# Patient Record
Sex: Male | Born: 1940
Health system: Southern US, Community
[De-identification: ages and names within clinical notes are randomized; demographics above are authoritative.]

## PROBLEM LIST (undated history)

## (undated) DIAGNOSIS — G4733 Obstructive sleep apnea (adult) (pediatric): Secondary | ICD-10-CM

## (undated) DIAGNOSIS — M1990 Unspecified osteoarthritis, unspecified site: Secondary | ICD-10-CM

## (undated) DIAGNOSIS — I2510 Atherosclerotic heart disease of native coronary artery without angina pectoris: Secondary | ICD-10-CM

## (undated) DIAGNOSIS — E785 Hyperlipidemia, unspecified: Secondary | ICD-10-CM

## (undated) DIAGNOSIS — R351 Nocturia: Secondary | ICD-10-CM

## (undated) DIAGNOSIS — C61 Malignant neoplasm of prostate: Secondary | ICD-10-CM

## (undated) DIAGNOSIS — R972 Elevated prostate specific antigen [PSA]: Secondary | ICD-10-CM

## (undated) DIAGNOSIS — Z872 Personal history of diseases of the skin and subcutaneous tissue: Secondary | ICD-10-CM

## (undated) DIAGNOSIS — Z87448 Personal history of other diseases of urinary system: Secondary | ICD-10-CM

## (undated) HISTORY — DX: Elevated prostate specific antigen (PSA): R97.20

## (undated) HISTORY — DX: Hyperlipidemia, unspecified: E78.5

## (undated) HISTORY — PX: CARDIAC CATHETERIZATION: SHX172

## (undated) HISTORY — DX: Nocturia: R35.1

## (undated) HISTORY — DX: Malignant neoplasm of prostate: C61

## (undated) HISTORY — PX: PROSTATE BIOPSY: SHX241

## (undated) HISTORY — DX: Obstructive sleep apnea (adult) (pediatric): G47.33

## (undated) HISTORY — DX: Atherosclerotic heart disease of native coronary artery without angina pectoris: I25.10

---
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
## 1994-01-06 DIAGNOSIS — C439 Malignant melanoma of skin, unspecified: Secondary | ICD-10-CM

## 1994-01-06 HISTORY — DX: Malignant melanoma of skin, unspecified: C43.9

## 1995-01-07 HISTORY — PX: MELANOMA EXCISION: SHX5266

## 2002-01-06 DIAGNOSIS — Z872 Personal history of diseases of the skin and subcutaneous tissue: Secondary | ICD-10-CM | POA: Insufficient documentation

## 2002-01-06 HISTORY — DX: Personal history of diseases of the skin and subcutaneous tissue: Z87.2

## 2009-11-06 HISTORY — PX: ROTATOR CUFF REPAIR: SHX139

## 2010-12-20 DIAGNOSIS — C61 Malignant neoplasm of prostate: Secondary | ICD-10-CM

## 2010-12-20 HISTORY — DX: Malignant neoplasm of prostate: C61

## 2011-01-13 ENCOUNTER — Encounter: Payer: Self-pay | Admitting: Radiation Oncology

## 2011-01-13 NOTE — Progress Notes (Signed)
71 year old male. Married with three children. Works full-time.   Elevated PSA noted since 12/12/2009. Patient returned frequently to his urologist for surveillance but, refused biopsy. 11/25/2010 PSA 4.9. 12/20/10 biopsy returns with prostate ca at right base Gleason 6 (3+3). Patient is interested in seeds.   NKDA No indication of a pacemaker No history of radiation therapy

## 2011-01-14 ENCOUNTER — Ambulatory Visit
Admission: RE | Admit: 2011-01-14 | Discharge: 2011-01-14 | Disposition: A | Discharge status: Home / Self Care | Payer: Medicare Other | Source: Ambulatory Visit | Attending: Radiation Oncology | Admitting: Radiation Oncology

## 2011-01-14 ENCOUNTER — Encounter: Payer: Self-pay | Admitting: Radiation Oncology

## 2011-01-14 VITALS — BP 173/76 | HR 65 | Temp 97.4°F | Resp 18 | Ht 72.0 in | Wt 315.5 lb

## 2011-01-14 DIAGNOSIS — Z85820 Personal history of malignant melanoma of skin: Secondary | ICD-10-CM | POA: Insufficient documentation

## 2011-01-14 DIAGNOSIS — C61 Malignant neoplasm of prostate: Secondary | ICD-10-CM

## 2011-01-14 NOTE — Progress Notes (Signed)
Patient presents to the clinic today accompanied by his wife for an initial consultation with Dr. Dayton Scrape. Patient is alert and oriented to person, place, and time. No distress noted. Pleasant affect noted. Patient denies pain at this time. Patient reports getting up 2-3 times during the night to void. Patient reports that he stopped taking his Flomax some time ago because it caused constipation. Patient reports only taking Celebrex for a short time follow rotator cuff surgery. Patient reports that he does not take any prescribed medication. Patient reports that he saw blood in his urine for 2.5 weeks following biopsy. Patient denies pain or burning with urination. Patient reports that he work regularly but not as much as he did before his surgery and biopsy. Patient brought disc of CT done just prior to this consultation for Dr. Dayton Scrape to review. Reported all findings to Dr. Dayton Scrape. IPSS score 4.

## 2011-01-14 NOTE — Progress Notes (Signed)
Completed PATIENT MEASURE OF DISTRESS worksheet with a score of 0 turned into social work.

## 2011-01-14 NOTE — Progress Notes (Signed)
[facility] Radiation Oncology NEW PATIENT EVALUATION  Name: Anthony Perkins MRN: [id number]  Date: 01/14/2011  DOB: 03-Nov-1940  Status: outpatient   CC: No primary provider on file.  Debroah Baller, MD    REFERRING PHYSICIAN: Debroah Baller, MD   DIAGNOSIS: Stage TI C. favorable risk adenocarcinoma of the prostate  HISTORY OF PRESENT ILLNESS:  Anthony Perkins is a 71 y.o. male who is seen today for the courtesy of Dr. Saddie Benders for evaluation of his stage TI C. stable risk adenocarcinoma prostate he was noted to have a rise in his PSA to 4.9 on 09/30/2010. He underwent ultrasound-guided biopsies on 12/20/2010 revealing Gleason 6 (3+3) adenocarcinoma involving 50% of one core from the right lateral base, 16% of one core from the right lateral mid gland and 6% of one core from the right medial base. Prostate volume was 42 cc with a prosthetic length of 4.2 cm. Remaining biopsies were benign. He does have a history of urinary obstructive symptomatology and he did take Flomax for a short time but this was discontinued secondary to constipation. All Flomax, his IPS S. score today is 4. No GI difficulties. He does have erectile dysfunction. He was given a prescription for Cialis but he never took filledthe prescription. He is not sexually active.    PREVIOUS RADIATION THERAPY: No   PAST MEDICAL HISTORY:  has a past medical history of BPH with obstruction/lower urinary tract symptoms; PSA elevation; Elevated prostate specific antigen (PSA); and Nocturia.     PAST SURGICAL HISTORY:  Past Surgical History  Procedure Date  . Tonsillectomy   . Prostate biopsy   . Rotator cutff surgery     right shoulder   . Melanoma removed 1997    left ear     FAMILY HISTORY: family history includes Cancer in his father and paternal grandmother. his father died from cardiac disease at age 25. His mother died from complications of Alzheimer's disease in a hip fracture in her 9s. No family history of  prostate cancer.   SOCIAL HISTORY:  reports that he has never smoked. He has never used smokeless tobacco. He reports that he does not drink alcohol or use illicit drugs. He works as a Naval architect.   ALLERGIES: Hydrocodone   MEDICATIONS:  Current Outpatient Prescriptions  Medication Sig Dispense Refill  . celecoxib (CELEBREX) 100 MG capsule Take 100 mg by mouth 2 (two) times daily.        . Tamsulosin HCl (FLOMAX) 0.4 MG CAPS Take 0.4 mg by mouth daily. 0.4 mg capsule one oral daily, 1/2 hour after supper           REVIEW OF SYSTEMS:  Pertinent items are noted in HPI.    PHYSICAL EXAM:  height is 6' (1.829 m) and weight is 315 lb 8 oz (143.11 kg). His oral temperature is 97.4 F (36.3 C). His blood pressure is 173/76 and his pulse is 65. His respiration is 18.   Head and neck examination: Grossly unremarkable. Nodes: Without palpable cervical or supraclavicular lymphadenopathy. Chest: Lungs clear. Back: Without spinal or CVA discomfort. Heart: Regular in rhythm. Abdomen: Obese, without palpable hepatomegaly. Genitalia: Grossly unremarkable to inspection. Rectal: I am only able to palpate the inferior aspect of his prostate which is without induration or nodularity. Extremities trace ankle edema. Neurologic examination: Grossly nonfocal.   LABORATORY DATA:  No results found for this basename: WBC, HGB, HCT, MCV, PLT   No results found for this basename: NA, K, CL,  CO2   No results found for this basename: ALT, AST, GGT, ALKPHOS, BILITOT   PSA 4.9 from 09/30/2010.    IMPRESSION: Stage TI C. favorable risk adenocarcinoma prostate. I explained to the patient and his wife that his prognosis is related to his stage, PSA level, and Gleason score. All are favorable. We discussed surgery versus observation versus radiation therapy. Radiation therapy options include 8 weeks of external beam/IMRT or seed implantation alone. I explained to the patient that in patients in his situation are  more likely to died from cardiac disease rather than prostate cancer. On the other hand, in patients who have a 10 year life expectancy, it is certainly reasonable to proceed with curative treatment. He would be a good candidate for seed implantation in view of his prostate volume and lack of significant obstructive urinary symptomatology. I discussed the potential acute and late toxicities of radiation therapy. I also explained to him that he could be monitored closely by Dr. Saddie Benders with serial PSA determinations, and even a repeat biopsy in one year should he choose observation/close surveillance. At this point in time he is interested in seed implantation would like to delay his treatment so he can attend to financial concerns. He may be interested in having seed implantation the summer or fall. In the meantime, he can be followed closely by Dr. Saddie Benders. I gave the patient my voicemail to contact me should he want to proceed with seed implantation. The next at would be to perform a CT arch study.   PLAN: As described above. He'll maintain his followup with Dr. Saddie Benders.    I spent 60 minutes minutes face to face with the patient and more than 50% of that time was spent in counseling and/or coordination of care.

## 2011-01-14 NOTE — Progress Notes (Signed)
Please see the Nurse Progress Note in the MD Initial Consult Encounter for this patient. 

## 2011-01-14 NOTE — Progress Notes (Signed)
Encounter addended by: Ardell Isaacs on: 01/14/2011  3:43 PM<BR>     Documentation filed: Charges VN

## 2011-04-17 ENCOUNTER — Ambulatory Visit: Payer: Medicare Other | Admitting: Radiation Oncology

## 2011-06-17 ENCOUNTER — Encounter: Payer: Self-pay | Admitting: Radiation Oncology

## 2011-06-18 ENCOUNTER — Encounter: Payer: Self-pay | Admitting: *Deleted

## 2011-06-19 ENCOUNTER — Encounter: Payer: Self-pay | Admitting: Radiation Oncology

## 2011-06-19 ENCOUNTER — Ambulatory Visit
Admission: RE | Admit: 2011-06-19 | Discharge: 2011-06-19 | Disposition: A | Discharge status: Home / Self Care | Payer: Medicare Other | Source: Ambulatory Visit | Attending: Radiation Oncology | Admitting: Radiation Oncology

## 2011-06-19 VITALS — BP 117/67 | HR 59 | Temp 97.5°F | Resp 20 | Wt 304.3 lb

## 2011-06-19 DIAGNOSIS — C61 Malignant neoplasm of prostate: Secondary | ICD-10-CM | POA: Insufficient documentation

## 2011-06-19 DIAGNOSIS — Z85820 Personal history of malignant melanoma of skin: Secondary | ICD-10-CM | POA: Insufficient documentation

## 2011-06-19 HISTORY — DX: Unspecified osteoarthritis, unspecified site: M19.90

## 2011-06-19 NOTE — Progress Notes (Signed)
Complex simulation/treatment planning note:  The patient was taken to the CT simulator for his CT arch study. His pelvis was scanned. I contoured his prostate and projected the volume over his pubic arch. His prostate volume is approximately 33 cc with a prostatic length of 3.9 cm. The arch is open. I am prescribing 14,500 cGy utilizing I-125 seeds with the Nucletron system.

## 2011-06-19 NOTE — Progress Notes (Signed)
Please see the Nurse Progress Note in the MD Initial Consult Encounter for this patient. 

## 2011-06-19 NOTE — Progress Notes (Signed)
Has had Cortisone injection in knees, unsure of date.  Recent insect/spider bite on inner left forearm; has seen dr and is on 2 antibiotics. Denies pain in this area. Area is slightly swollen, red. Pt states it is better since beginning meds.

## 2011-06-19 NOTE — Addendum Note (Signed)
Encounter addended by: Glennie Hawk, RN on: 06/19/2011 11:11 AM<BR>     Documentation filed: Charges VN

## 2011-06-19 NOTE — Addendum Note (Signed)
Encounter addended by: Maryln Gottron, MD on: 06/19/2011 10:47 AM<BR>     Documentation filed: Normajean Glasgow VN

## 2011-06-19 NOTE — Progress Notes (Signed)
Followup note:  Diagnosis: Stage TI C. favorable risk adenocarcinoma prostate  Mr. Fortner returns today for review and scheduling of his prostate seed implant. I saw the patient in consultation on 01/14/2011. He presented with Gleason 6 adenocarcinoma involving 50% of one core from the right lateral base, 16% of one core from right lateral mid gland and 6% of one core from the right medial base. His gland volume was approximately 42 cc with a prostatic length of 4.2 cm. His I PSS score was 4 and it remains at 4 today. No GI difficulties.  Physical examination: He is not examined today.  Impression: Stage TI C. favorable risk adenocarcinoma prostate. His CT arch study was performed this morning and he is found to have a prostate volume of approximately 33 cc with a prostatic length of 3.9 cm. His arch is open. We discussed the potential acute and late toxicities of seed implantation and he wishes to proceed as outlined. Consent was signed today.  Plan: We will move ahead with his prostate implant schedule with Dr. Albin Felling. I suspect he'll get his seed implant and late July or early August.  30 minutes was spent face-to-face with the patient, primarily counseling the patient.

## 2011-06-24 ENCOUNTER — Telehealth: Payer: Self-pay | Admitting: *Deleted

## 2011-06-24 NOTE — Telephone Encounter (Signed)
CALLED PATIENT TO INFORM OF IMPLANT DATE, LVM FOR A RETURN CALL 

## 2011-06-26 ENCOUNTER — Ambulatory Visit (HOSPITAL_BASED_OUTPATIENT_CLINIC_OR_DEPARTMENT_OTHER)
Admission: RE | Admit: 2011-06-26 | Discharge: 2011-06-26 | Disposition: A | Discharge status: Home / Self Care | Payer: Medicare Other | Source: Ambulatory Visit | Attending: Urology | Admitting: Urology

## 2011-06-26 ENCOUNTER — Encounter (HOSPITAL_BASED_OUTPATIENT_CLINIC_OR_DEPARTMENT_OTHER)
Admission: RE | Admit: 2011-06-26 | Discharge: 2011-06-26 | Disposition: A | Discharge status: Home / Self Care | Payer: Medicare Other | Source: Ambulatory Visit | Attending: Urology | Admitting: Urology

## 2011-06-26 DIAGNOSIS — Z01818 Encounter for other preprocedural examination: Secondary | ICD-10-CM | POA: Insufficient documentation

## 2011-06-26 DIAGNOSIS — C61 Malignant neoplasm of prostate: Secondary | ICD-10-CM | POA: Insufficient documentation

## 2011-07-22 ENCOUNTER — Encounter (HOSPITAL_BASED_OUTPATIENT_CLINIC_OR_DEPARTMENT_OTHER): Payer: Self-pay | Admitting: *Deleted

## 2011-07-22 ENCOUNTER — Telehealth: Payer: Self-pay | Admitting: *Deleted

## 2011-07-22 NOTE — Telephone Encounter (Signed)
CALLED PATIENT TO REMIND OF APPT. FOR 07-23-11, SPOKE WITH PATIENT AND HE IS AWARE.

## 2011-07-23 ENCOUNTER — Encounter (HOSPITAL_BASED_OUTPATIENT_CLINIC_OR_DEPARTMENT_OTHER): Payer: Self-pay | Admitting: *Deleted

## 2011-07-23 NOTE — Progress Notes (Addendum)
NPO AFTER MN WITH EXCEPTION CLEAR LIQUIDS UNTIL 0800 (NO CREAM/ MILK PRODUCTS). ARRIVES AT 1245.  CURRENT CXR AND EKG IN EPIC AND CHART. LAB WORK TO BE DONE AT DR CHAO'S OFFICE AND RESULTS FAXED.  WILL DO FLEET ENEMA AM OF SURG.

## 2011-07-25 NOTE — H&P (Signed)
NAME:  Anthony Perkins, LARD NO.:  [id number]  MEDICAL RECORD NO.:  [id number]  LOCATION:                               FACILITY:  [facility]  PHYSICIAN:  Debroah Baller, M.D.     DATE OF BIRTH:  02/26/1940  DATE OF ADMISSION:  07/30/2011 DATE OF DISCHARGE:                             HISTORY & PHYSICAL   DATE OF SURGERY:  July 30, 2011  HISTORY OF PRESENT ILLNESS:  Mr. Linck is a 71 year old man with a history of stage T1c adenocarcinoma of the prostate.  His diagnosis was prompted by a PSA of 4.9 and the biopsies showing cores in the right lateral base and midgland showing a Gleason 6 adenocarcinoma.  He has considered his options and has opted for definitive therapy with radioactive seed implantation.  PAST MEDICAL HISTORY:  Significant for BPH, obesity.  He has had previous tonsillectomy.  He had rotator cuff surgery of the right shoulder and a melanoma removed from his left ear.  FAMILY HISTORY:  Significant for cardiac disease.  There is no family history of prostate cancer.  SOCIAL HISTORY:  The patient has never smoked.  ALLERGIES:  He is allergic to HYDROCODONE.  MEDICATIONS:  Include Jalyn and Celebrex.  REVIEW OF SYSTEMS:  Negative for diabetes, chest pain, shortness of breath.  Bowel movements are regular.  PHYSICAL EXAMINATION:  GENERAL:  Well-developed obese man, in no acute distress. HEAD AND NECK:  Unremarkable.  No palpable nodes in the cervical or supraclavicular area. LUNGS:  Clear to auscultation. BACK:  Without CVA tenderness. HEART:  Sounds are regular rate and rhythm. ABDOMEN:  Obese without palpable hepatomegaly. GU:  Shows normal penis and both testes descended. RECTAL:  Approximately 40 g prostate, smooth, nontender, no nodules. Adequate sphincter tone. EXTREMITIES:  Show trace ankle edema.  No calf tenderness. NEUROLOGICAL:  Neurologically, the exam is grossly nonfocal.  Pertinent laboratories are pending.  IMPRESSION:  Stage T1c  adenocarcinoma of the prostate.  The patient and his wife have been counseled as to alternative therapies.  He has opted for definitive therapy with radioactive seed implantation.  So, the plan is to proceed with ultrasound and radioactive seed implantation of the prostate with a cystoscopy.  The risks and benefits have been discussed with the patient and he understands and wishes to proceed.          ______________________________ Debroah Baller, M.D.     RC/MEDQ  D:  07/24/2011  T:  07/25/2011  Job:  [id number]

## 2011-07-29 ENCOUNTER — Telehealth: Payer: Self-pay | Admitting: *Deleted

## 2011-07-29 NOTE — Telephone Encounter (Signed)
Called patient to remind of procedure for 07-30-11, lvm for a return call.

## 2011-07-29 NOTE — Telephone Encounter (Signed)
Called patient to remind of procedure for 07-30-11, voice mail full could not leave message, will try later to call

## 2011-07-30 ENCOUNTER — Encounter (HOSPITAL_BASED_OUTPATIENT_CLINIC_OR_DEPARTMENT_OTHER)
Admission: RE | Disposition: A | Discharge status: Home / Self Care | Payer: Self-pay | Source: Ambulatory Visit | Attending: Urology

## 2011-07-30 ENCOUNTER — Ambulatory Visit (HOSPITAL_BASED_OUTPATIENT_CLINIC_OR_DEPARTMENT_OTHER)
Admission: RE | Admit: 2011-07-30 | Discharge: 2011-07-30 | Disposition: A | Discharge status: Home / Self Care | Payer: Medicare Other | Source: Ambulatory Visit | Attending: Urology | Admitting: Urology

## 2011-07-30 ENCOUNTER — Encounter (HOSPITAL_BASED_OUTPATIENT_CLINIC_OR_DEPARTMENT_OTHER): Payer: Self-pay | Admitting: Anesthesiology

## 2011-07-30 ENCOUNTER — Ambulatory Visit (HOSPITAL_COMMUNITY): Payer: Medicare Other

## 2011-07-30 ENCOUNTER — Ambulatory Visit (HOSPITAL_BASED_OUTPATIENT_CLINIC_OR_DEPARTMENT_OTHER): Payer: Medicare Other | Admitting: Anesthesiology

## 2011-07-30 ENCOUNTER — Encounter (HOSPITAL_BASED_OUTPATIENT_CLINIC_OR_DEPARTMENT_OTHER): Payer: Self-pay | Admitting: Urology

## 2011-07-30 ENCOUNTER — Encounter (HOSPITAL_BASED_OUTPATIENT_CLINIC_OR_DEPARTMENT_OTHER): Payer: Self-pay | Admitting: *Deleted

## 2011-07-30 ENCOUNTER — Encounter: Payer: Self-pay | Admitting: Radiation Oncology

## 2011-07-30 DIAGNOSIS — C61 Malignant neoplasm of prostate: Secondary | ICD-10-CM | POA: Insufficient documentation

## 2011-07-30 DIAGNOSIS — E669 Obesity, unspecified: Secondary | ICD-10-CM | POA: Insufficient documentation

## 2011-07-30 HISTORY — PX: RADIOACTIVE SEED IMPLANT: SHX5150

## 2011-07-30 HISTORY — PX: CYSTOSCOPY: SHX5120

## 2011-07-30 HISTORY — DX: Personal history of other diseases of urinary system: Z87.448

## 2011-07-30 HISTORY — DX: Personal history of diseases of the skin and subcutaneous tissue: Z87.2

## 2011-07-30 SURGERY — INSERTION, RADIATION SOURCE, PROSTATE
Anesthesia: General | Site: Prostate | Wound class: Clean Contaminated

## 2011-07-30 MED ORDER — LACTATED RINGERS IV SOLN: INTRAVENOUS | Status: DC

## 2011-07-30 MED ORDER — LIDOCAINE HCL (CARDIAC) 20 MG/ML IV SOLN
INTRAVENOUS | Status: DC | PRN
  Administered 2011-07-30: 80 mg via INTRAVENOUS

## 2011-07-30 MED ORDER — IOHEXOL 350 MG/ML SOLN
INTRAVENOUS | Status: DC | PRN
  Administered 2011-07-30: 7 mL

## 2011-07-30 MED ORDER — MEPERIDINE HCL 50 MG PO TABS
50.0000 mg | ORAL_TABLET | ORAL | Status: DC | PRN
  Administered 2011-07-30: 50 mg via ORAL

## 2011-07-30 MED ORDER — SUCCINYLCHOLINE CHLORIDE 20 MG/ML IJ SOLN
INTRAMUSCULAR | Status: DC | PRN
  Administered 2011-07-30: 180 mg via INTRAVENOUS

## 2011-07-30 MED ORDER — CIPROFLOXACIN IN D5W 400 MG/200ML IV SOLN
400.0000 mg | INTRAVENOUS | Status: AC
  Administered 2011-07-30: 400 mg via INTRAVENOUS

## 2011-07-30 MED ORDER — FENTANYL CITRATE 0.05 MG/ML IJ SOLN: 25.0000 ug | INTRAMUSCULAR | Status: DC | PRN

## 2011-07-30 MED ORDER — PROPOFOL 10 MG/ML IV EMUL
INTRAVENOUS | Status: DC | PRN
  Administered 2011-07-30: 250 mg via INTRAVENOUS

## 2011-07-30 MED ORDER — LACTATED RINGERS IV SOLN
INTRAVENOUS | Status: DC
  Administered 2011-07-30 (×3): via INTRAVENOUS

## 2011-07-30 MED ORDER — CIPROFLOXACIN HCL 500 MG PO TABS: 500.0000 mg | ORAL_TABLET | Freq: Two times a day (BID) | ORAL | Status: AC

## 2011-07-30 MED ORDER — BELLADONNA ALKALOIDS-OPIUM 16.2-60 MG RE SUPP
RECTAL | Status: DC | PRN
  Administered 2011-07-30: 1 via RECTAL

## 2011-07-30 MED ORDER — MEPERIDINE HCL 25 MG/ML IJ SOLN: 6.2500 mg | INTRAMUSCULAR | Status: DC | PRN

## 2011-07-30 MED ORDER — ONDANSETRON HCL 4 MG/2ML IJ SOLN
INTRAMUSCULAR | Status: DC | PRN
  Administered 2011-07-30: 4 mg via INTRAVENOUS

## 2011-07-30 MED ORDER — PROMETHAZINE HCL 25 MG/ML IJ SOLN: 6.2500 mg | INTRAMUSCULAR | Status: DC | PRN

## 2011-07-30 MED ORDER — FLEET ENEMA 7-19 GM/118ML RE ENEM: 1.0000 | ENEMA | Freq: Once | RECTAL | Status: DC

## 2011-07-30 MED ORDER — MEPERIDINE HCL 50 MG PO TABS: 50.0000 mg | ORAL_TABLET | ORAL | Status: AC | PRN

## 2011-07-30 MED ORDER — DEXAMETHASONE SODIUM PHOSPHATE 4 MG/ML IJ SOLN
INTRAMUSCULAR | Status: DC | PRN
  Administered 2011-07-30: 10 mg via INTRAVENOUS

## 2011-07-30 MED ORDER — FENTANYL CITRATE 0.05 MG/ML IJ SOLN
INTRAMUSCULAR | Status: DC | PRN
  Administered 2011-07-30 (×9): 25 ug via INTRAVENOUS
  Administered 2011-07-30: 50 ug via INTRAVENOUS
  Administered 2011-07-30: 25 ug via INTRAVENOUS

## 2011-07-30 MED ORDER — STERILE WATER FOR IRRIGATION IR SOLN
Status: DC | PRN
  Administered 2011-07-30: 3000 mL

## 2011-07-30 SURGICAL SUPPLY — 22 items
BLADE SURG ROTATE 9660 (MISCELLANEOUS) ×3 IMPLANT
CATH FOLEY 2WAY SLVR  5CC 16FR (CATHETERS) ×2
CATH FOLEY 2WAY SLVR 5CC 16FR (CATHETERS) ×4 IMPLANT
CATH ROBINSON RED A/P 20FR (CATHETERS) ×3 IMPLANT
CLOTH BEACON ORANGE TIMEOUT ST (SAFETY) ×3 IMPLANT
COVER MAYO STAND STRL (DRAPES) ×3 IMPLANT
COVER TABLE BACK 60X90 (DRAPES) ×3 IMPLANT
DRSG TEGADERM 4X4.75 (GAUZE/BANDAGES/DRESSINGS) ×3 IMPLANT
DRSG TEGADERM 8X12 (GAUZE/BANDAGES/DRESSINGS) ×3 IMPLANT
GAUZE SPONGE 4X4 12PLY STRL LF (GAUZE/BANDAGES/DRESSINGS) ×3 IMPLANT
GLOVE BIO SURGEON STRL SZ7.5 (GLOVE) ×12 IMPLANT
GLOVE INDICATOR 6.5 STRL GRN (GLOVE) ×6 IMPLANT
GLOVE SURG SIGNA 7.5 PF LTX (GLOVE) ×6 IMPLANT
GOWN PREVENTION PLUS LG XLONG (DISPOSABLE) ×3 IMPLANT
GOWN STRL REIN XL XLG (GOWN DISPOSABLE) ×3 IMPLANT
HOLDER FOLEY CATH W/STRAP (MISCELLANEOUS) ×3 IMPLANT
PACK CYSTOSCOPY (CUSTOM PROCEDURE TRAY) ×3 IMPLANT
Radioactive Seeds ×3 IMPLANT
SYRINGE 10CC LL (SYRINGE) ×3 IMPLANT
UNDERPAD 30X30 INCONTINENT (UNDERPADS AND DIAPERS) ×6 IMPLANT
WATER STERILE IRR 3000ML UROMA (IV SOLUTION) ×3 IMPLANT
WATER STERILE IRR 500ML POUR (IV SOLUTION) ×3 IMPLANT

## 2011-07-30 NOTE — Interval H&P Note (Signed)
History and Physical Interval Note:  07/30/2011 2:01 PM  Anthony Perkins  has presented today for surgery, with the diagnosis of PROSTATE CANCER  The various methods of treatment have been discussed with the patient and family. After consideration of risks, benefits and other options for treatment, the patient has consented to  Procedure(s) (LRB): RADIOACTIVE SEED IMPLANT (N/A) as a surgical intervention .  The patient's history has been reviewed, patient examined, no change in status, stable for surgery.  I have reviewed the patient's chart and labs.  Questions were answered to the patient's satisfaction.     Zsazsa Bahena

## 2011-07-30 NOTE — H&P (Signed)
  H and P unchanged since last taken- ready for surgery

## 2011-07-30 NOTE — Progress Notes (Signed)
End of treatment summary:  Diagnosis: Stage TI C. favorable risk adenocarcinoma prostate  Requesting physician: Dr. Debroah Baller  Implant date: 07/30/2011  Site/dose: Prostate 14,500 cGy, isotope I-125 utilizing 64 seeds and 26 active needles. Individual seed activity 0.431 mCi per seed for a total implant activity of 27.6 mCi.  Narrative: The patient appears to have undergone a successful Nucletron seed Selectron implant with Dr. Saddie Benders.  Plan: He is to visit Dr. Saddie Benders tomorrow morning and return to see me for a followup visit in approximately 3 weeks.

## 2011-07-30 NOTE — Anesthesia Procedure Notes (Signed)
Procedure Name: Intubation Date/Time: 07/30/2011 2:22 PM Performed by: Fran Lowes Pre-anesthesia Checklist: Patient identified, Emergency Drugs available, Suction available and Patient being monitored Patient Re-evaluated:Patient Re-evaluated prior to inductionOxygen Delivery Method: Circle System Utilized Preoxygenation: Pre-oxygenation with 100% oxygen Intubation Type: IV induction Ventilation: Two handed mask ventilation required Laryngoscope Size: Mac and 4 Grade View: Grade II Tube type: Oral Tube size: 8.0 mm Number of attempts: 1 Airway Equipment and Method: stylet,  oral airway and LTA kit utilized Placement Confirmation: ETT inserted through vocal cords under direct vision,  positive ETCO2 and breath sounds checked- equal and bilateral Tube secured with: Tape Dental Injury: Teeth and Oropharynx as per pre-operative assessment

## 2011-07-30 NOTE — H&P (View-Only) (Signed)
NPO AFTER MN WITH EXCEPTION CLEAR LIQUIDS UNTIL 0800 (NO CREAM/ MILK PRODUCTS). ARRIVES AT 1245.  CURRENT CXR AND EKG IN EPIC AND CHART. LAB WORK TO BE DONE AT DR CHAO'S OFFICE AND RESULTS FAXED.  WILL DO FLEET ENEMA AM OF SURG. 

## 2011-07-30 NOTE — Anesthesia Postprocedure Evaluation (Signed)
Anesthesia Post Note  Patient: Anthony Perkins  Procedure(s) Performed: Procedure(s) (LRB): RADIOACTIVE SEED IMPLANT (N/A) CYSTOSCOPY FLEXIBLE (N/A)  Anesthesia type: General  Patient location: PACU  Post pain: Pain level controlled  Post assessment: Post-op Vital signs reviewed  Last Vitals:  Filed Vitals:   07/30/11 1620  BP: 134/79  Pulse: 66  Temp: 35.9 C  Resp: 11    Post vital signs: Reviewed  Level of consciousness: sedated  Complications: No apparent anesthesia complications

## 2011-07-30 NOTE — Op Note (Signed)
Op note dictated for Anthony Perkins- MRN: [id number]  CSN: [id number] , Dictation number- [id number]  Done 07/30/11

## 2011-07-30 NOTE — Progress Notes (Signed)
Geiger survey -negative. 

## 2011-07-30 NOTE — Transfer of Care (Signed)
Immediate Anesthesia Transfer of Care Note  Patient: Anthony Perkins  Procedure(s) Performed: Procedure(s) (LRB): RADIOACTIVE SEED IMPLANT (N/A) CYSTOSCOPY FLEXIBLE (N/A)  Patient Location: Patient transported to PACU with oxygen via face mask at 6 Liters / Min  Anesthesia Type: General  Level of Consciousness: awake and alert   Airway & Oxygen Therapy: Patient Spontanous Breathing and Patient connected to face mask oxygen  Post-op Assessment: Report given to PACU RN and Post -op Vital signs reviewed and stable  Post vital signs: Reviewed and stable  Dentition: Teeth and oropharynx remain in pre-op condition  Complications: No apparent anesthesia complications

## 2011-07-30 NOTE — Progress Notes (Signed)
[facility] Radiation Oncology Brachytherapy Operative Procedure Note  Name: Anthony Perkins MRN: [id number]  Date:   06/24/2011           DOB: 06/07/1940  Status:outpatient    CC:  Dr. Debroah Baller DIAGNOSIS: A 71 year old gentlemen with stage stage TI C. adenocarcinoma of the prostate with a Gleason of 6 and a PSA of 4.9.  PROCEDURE: Insertion of radioactive I-125 seeds into the prostate gland.  RADIATION DOSE: 145 Gy, definitive therapy.  TECHNIQUE: [name] was brought to the operating room with Dr. Saddie Benders. He was placed in the dorsolithotomy position. He was catheterized and a rectal tube was inserted. The perineum was shaved, prepped and draped. The ultrasound probe was then introduced into the rectum to see the prostate gland.  TREATMENT DEVICE: A needle grid was attached to the ultrasound probe stand and anchor needles were placed.  COMPLEX ISODOSE CALCULATION: The prostate was imaged in 3D using a sagittal sweep of the prostate probe. These images were transferred to the planning computer. There, the prostate, urethra and rectum were defined on each axial reconstructed image. Then, the software created an optimized plan and a few seed positions were adjusted. Then the accepted plan was uploaded to the seed Selectron afterloading unit.  SPECIAL TREATMENT PROCEDURE/SUPERVISION AND HANDLING: The Nucletron FIRST system was used to place the needles under sagittal guidance. A total of 26 needles were used to deposit 64 seeds in the prostate gland. The individual seed activity was 0.431 mCi for a total implant activity of 27.6 mCi.  COMPLEX SIMULATION: At the end of the procedure, an anterior radiograph of the pelvis was obtained to document seed positioning and count. Cystoscopy was performed to check the urethra and bladder.  MICRODOSIMETRY: At the end of the procedure, the patient was emitting 0.08 mrem/hr at 1 meter. Accordingly, he was considered safe for hospital  discharge.  PLAN: The patient will return to the radiation oncology clinic for post implant CT dosimetry in three weeks.

## 2011-07-30 NOTE — Anesthesia Preprocedure Evaluation (Signed)
Anesthesia Evaluation  Patient identified by MRN, date of birth, ID band Patient awake    Reviewed: Allergy & Precautions, H&P , NPO status , Patient's Chart, lab work & pertinent test results  Airway Mallampati: II TM Distance: >3 FB Neck ROM: Full    Dental No notable dental hx.    Pulmonary neg pulmonary ROS,  breath sounds clear to auscultation  Pulmonary exam normal       Cardiovascular negative cardio ROS  Rhythm:Regular Rate:Normal  RBBB   Neuro/Psych negative neurological ROS  negative psych ROS   GI/Hepatic negative GI ROS, Neg liver ROS,   Endo/Other  negative endocrine ROSMorbid obesity  Renal/GU negative Renal ROS  negative genitourinary   Musculoskeletal negative musculoskeletal ROS (+)   Abdominal   Peds negative pediatric ROS (+)  Hematology negative hematology ROS (+)   Anesthesia Other Findings   Reproductive/Obstetrics negative OB ROS                           Anesthesia Physical Anesthesia Plan  ASA: III  Anesthesia Plan: General   Post-op Pain Management:    Induction: Intravenous  Airway Management Planned: LMA  Additional Equipment:   Intra-op Plan:   Post-operative Plan: Extubation in OR  Informed Consent: I have reviewed the patients History and Physical, chart, labs and discussed the procedure including the risks, benefits and alternatives for the proposed anesthesia with the patient or authorized representative who has indicated his/her understanding and acceptance.   Dental advisory given  Plan Discussed with: CRNA  Anesthesia Plan Comments:         Anesthesia Quick Evaluation

## 2011-07-31 NOTE — Op Note (Signed)
NAME:  Anthony Perkins, Anthony Perkins NO.:  [id number]  MEDICAL RECORD NO.:  [id number]  LOCATION:                               FACILITY:  [facility]  PHYSICIAN:  Debroah Baller, M.D.     DATE OF BIRTH:  05/07/1940  DATE OF PROCEDURE:  07/30/2011 DATE OF DISCHARGE:                              OPERATIVE REPORT   PREOPERATIVE DIAGNOSIS:  Stage T1c prostate cancer.  POSTOPERATIVE DIAGNOSIS:  Stage T1c prostate cancer.  PROCEDURE:  Transrectal ultrasound, intraprostatic seed implantation of I-125 seeds and flexible cystoscopy.  SURGEON:  Debroah Baller, MD  CO-SURGEON:  Maryln Gottron, MD  ANESTHESIA USED:  General.  INDICATION FOR PROCEDURE:  The patient is a 71 year old white male with a history of prostate cancer in the right mid and base of his gland.  He has been presented his options and has opted for radioactive seed implantation for definitive therapies, brought in now for this procedure.  PROCEDURE IN DETAIL:  The patient was brought into the operating room, and placed on the table in the supine position.  After adequate general anesthesia was achieved, Foley catheter was placed, and then the patient was seen carefully placed in exaggerated lithotomy with the Yellofin stirrups.  The perineum was shaved and prepped.  The scrotum was then elevated and taped onto the lower abdomen.  The perineum was redraped for the performance of the procedure.  A red rubber catheter was placed into the rectum to relieve excess air.  The transrectal ultrasound probe was then placed and prostate was imaged from apex to seminal vesicles with good visualization of the prostate.  Prostatic anchors were then placed.  The prostate was then re-scanned from apex to seminal vesicles, and the images were captured by the Nucleotron program.  The individual images were then reviewed by myself and Dr. Dayton Scrape and the prostatic borders, urethra, and rectum were marked.  The computer programed  and calculated appropriate seed placement.  All of the individual images were then reviewed by myself to physician assistant and Dr. Dayton Scrape to ascertain seed placement.  Once we had good coverage of the prostate with sparing of the urethra and rectum, seed implantation was begun. The bladder neck was identified with Foley balloon and placed with contrast in the balloon.  The needles were then placed from anterior to posterior on the prostate in its entirety and no portion of the prostate was left untreated.  Once the final seeds were placed, the anchor needles were removed and pelvic x-ray was obtained, showing good distribution of the seeds.  Foley catheter was then removed.  The patient was then re-prepped and draped and flexible cystoscopy was performed which showed no seeds within the bladder.  A rectal examination was performed and this did not reveal any foreign bodies in the rectum.  Under sterile conditions, a 16-French Foley was then placed to drain the bladder.  The patient tolerated all this well.  He was then awakened and taken to the recovery room in good condition.          ______________________________ Debroah Baller, M.D.     RC/MEDQ  D:  07/30/2011  T:  07/31/2011  Job:  [id number] 

## 2011-08-04 ENCOUNTER — Encounter (HOSPITAL_BASED_OUTPATIENT_CLINIC_OR_DEPARTMENT_OTHER): Payer: Self-pay | Admitting: Urology

## 2011-08-19 ENCOUNTER — Encounter: Payer: Self-pay | Admitting: Radiation Oncology

## 2011-08-25 ENCOUNTER — Telehealth: Payer: Self-pay | Admitting: *Deleted

## 2011-08-25 NOTE — Telephone Encounter (Signed)
CALLED PATIENT TO REMIND OF APPTS. FOR 08-26-11, CONFIRMED APPTS. 

## 2011-08-26 ENCOUNTER — Encounter: Payer: Self-pay | Admitting: Radiation Oncology

## 2011-08-26 ENCOUNTER — Ambulatory Visit
Admission: RE | Admit: 2011-08-26 | Discharge: 2011-08-26 | Disposition: A | Discharge status: Home / Self Care | Payer: Medicare Other | Source: Ambulatory Visit | Attending: Radiation Oncology | Admitting: Radiation Oncology

## 2011-08-26 VITALS — BP 155/93 | HR 54 | Temp 97.2°F | Resp 20 | Wt 302.1 lb

## 2011-08-26 DIAGNOSIS — C61 Malignant neoplasm of prostate: Secondary | ICD-10-CM | POA: Insufficient documentation

## 2011-08-26 DIAGNOSIS — Z85820 Personal history of malignant melanoma of skin: Secondary | ICD-10-CM | POA: Insufficient documentation

## 2011-08-26 NOTE — Progress Notes (Signed)
Pt denies daytime freq, nocturia x 0-2. Stream weak, starts and stops at night. Denies bowel issues, fatigue, loss of appetite.

## 2011-08-26 NOTE — Progress Notes (Signed)
Followup note:  Mr. Petsch returns today approximately 3 weeks following his prostate seed implant with Dr. Saddie Benders in the management of his stage TI C. favorable risk adenocarcinoma prostate. He does report we can of his urinary stream with occasional hesitancy at night. Otherwise no urinary frequency or GI difficulties. He is on Jalyn through Dr. Saddie Benders. His pharmacist told that he could get generic drugs that could be cheaper for him. He plans on discussing this with Dr. Saddie Benders.  His CT scan today shows what appears to be an excellent seed distribution.  Physical examination not performed.  Impression: Satisfactory progress. I would he would benefit from continued use of an alpha blocker, alone or in combination.  Plan: Followup with Dr. Saddie Benders on August 26. We will forward the results of his post implant dosimetry to Dr. Saddie Benders with the next few weeks. I ask that Dr. Saddie Benders keep you posted on his progress.

## 2011-08-26 NOTE — Progress Notes (Signed)
Complex simulation note: The patient was taken to the CT simulator. His pelvis was scanned. He appears to have a good seed distribution. The CT data set will be transferred to the [facility] system for contouring of his prostate and rectum for his post implant dose volume histograms.

## 2011-09-01 ENCOUNTER — Encounter: Payer: Self-pay | Admitting: Radiation Oncology

## 2011-09-01 NOTE — Progress Notes (Signed)
3-D simulation/post implant CT dosimetry note:  The patient underwent 3-D simulation/post implant CT dosimetry today to assess the quality of his implant. Dose volume histograms were obtained for the rectum and prostate. His intraoperative prostate volume by ultrasound was 31.9 cc and his postoperative volume by CT was 27.2 cc, a reasonable correlation. His prostate D 90 is 110.9% and V100  94.1%, both excellent. 0 cc of rectum received the prescribed dose of 14,500 cGy. In summary, the patient has excellent post implant dosimetry with a low-risk for late rectal toxicity.

## 2012-12-16 IMAGING — CR DG CHEST 2V
2 series · 2 of 2 positions shown · non-contrast
Comparison: None.

CLINICAL DATA: Prostate cancer, preop.

CHEST - 2 VIEW

[w chest pa]
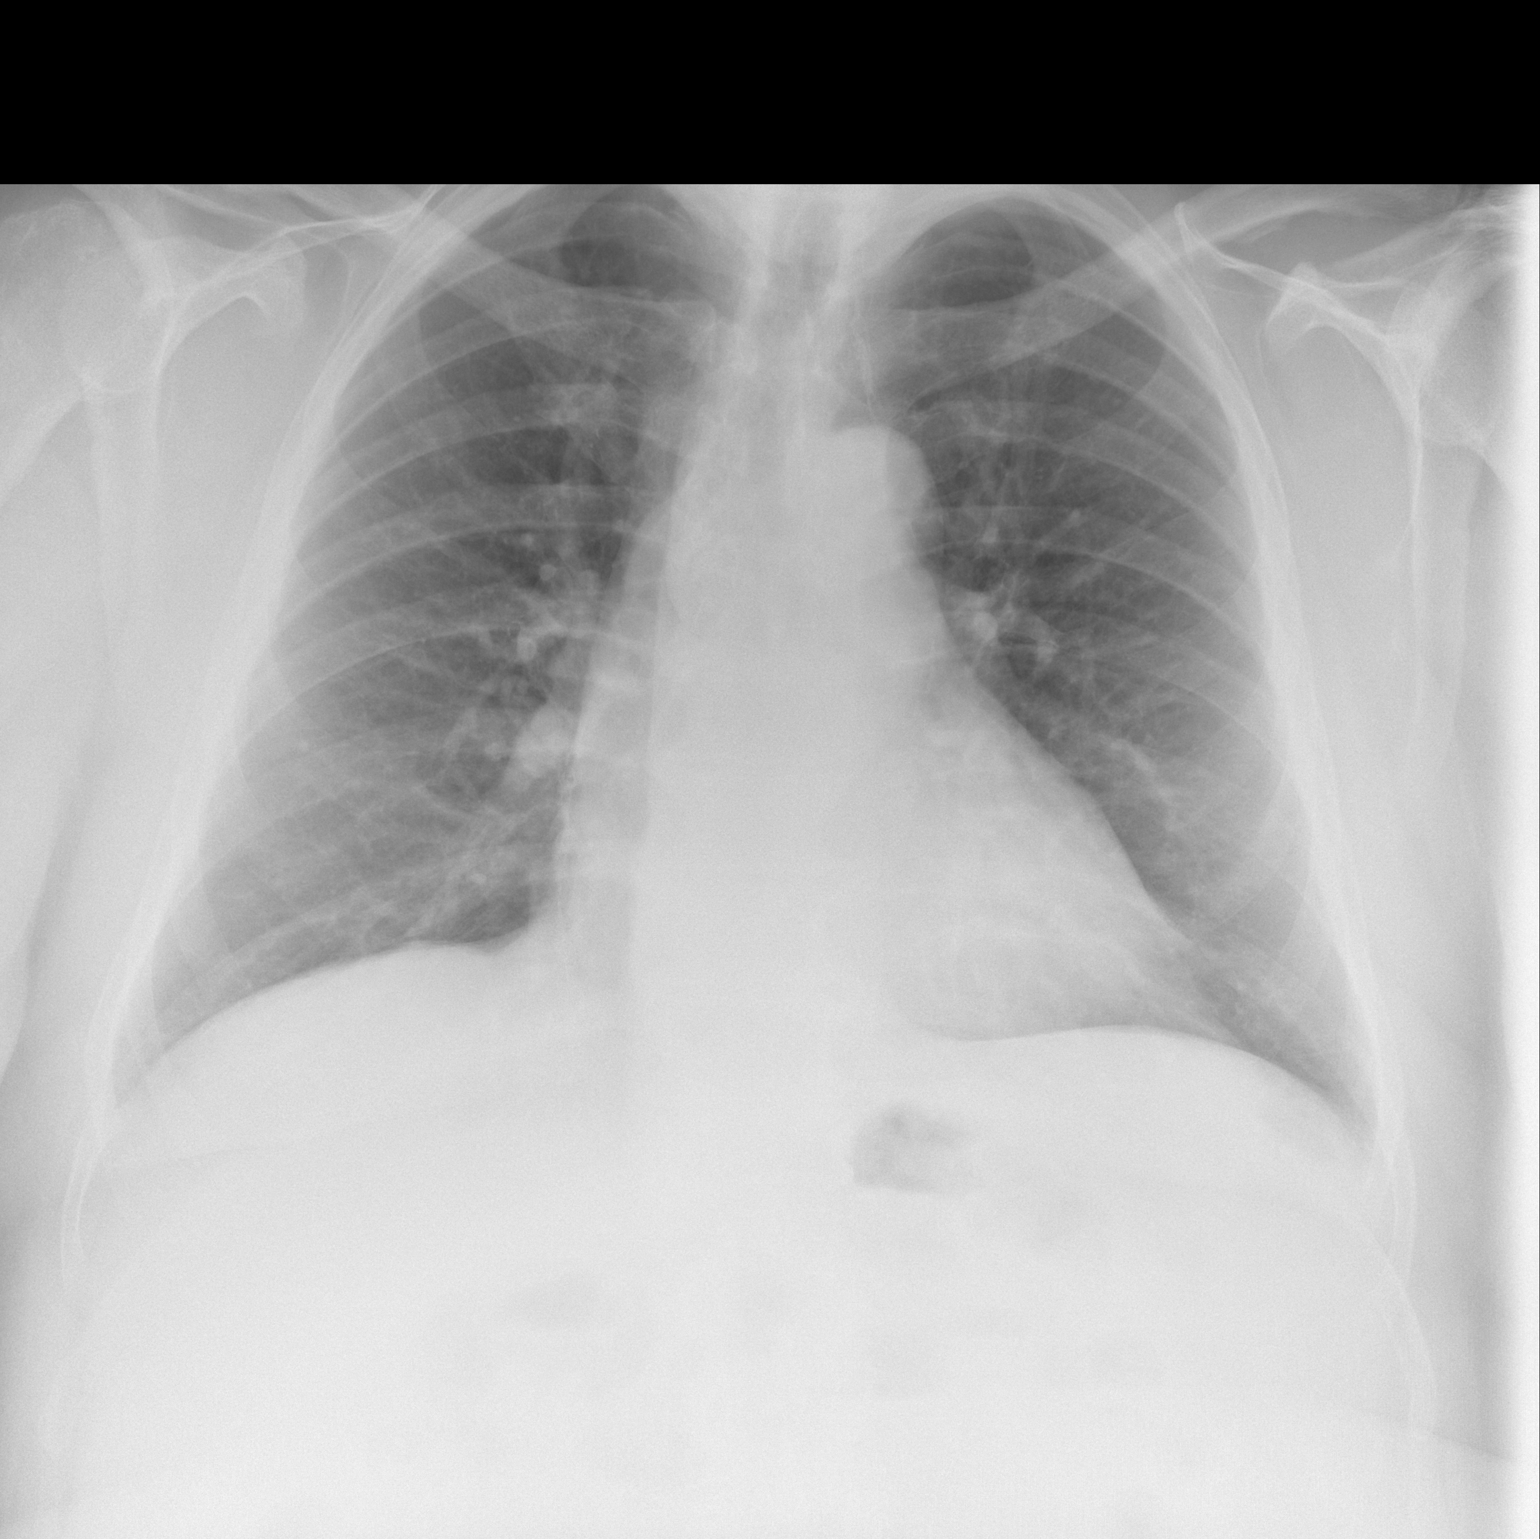

[w chest lat]
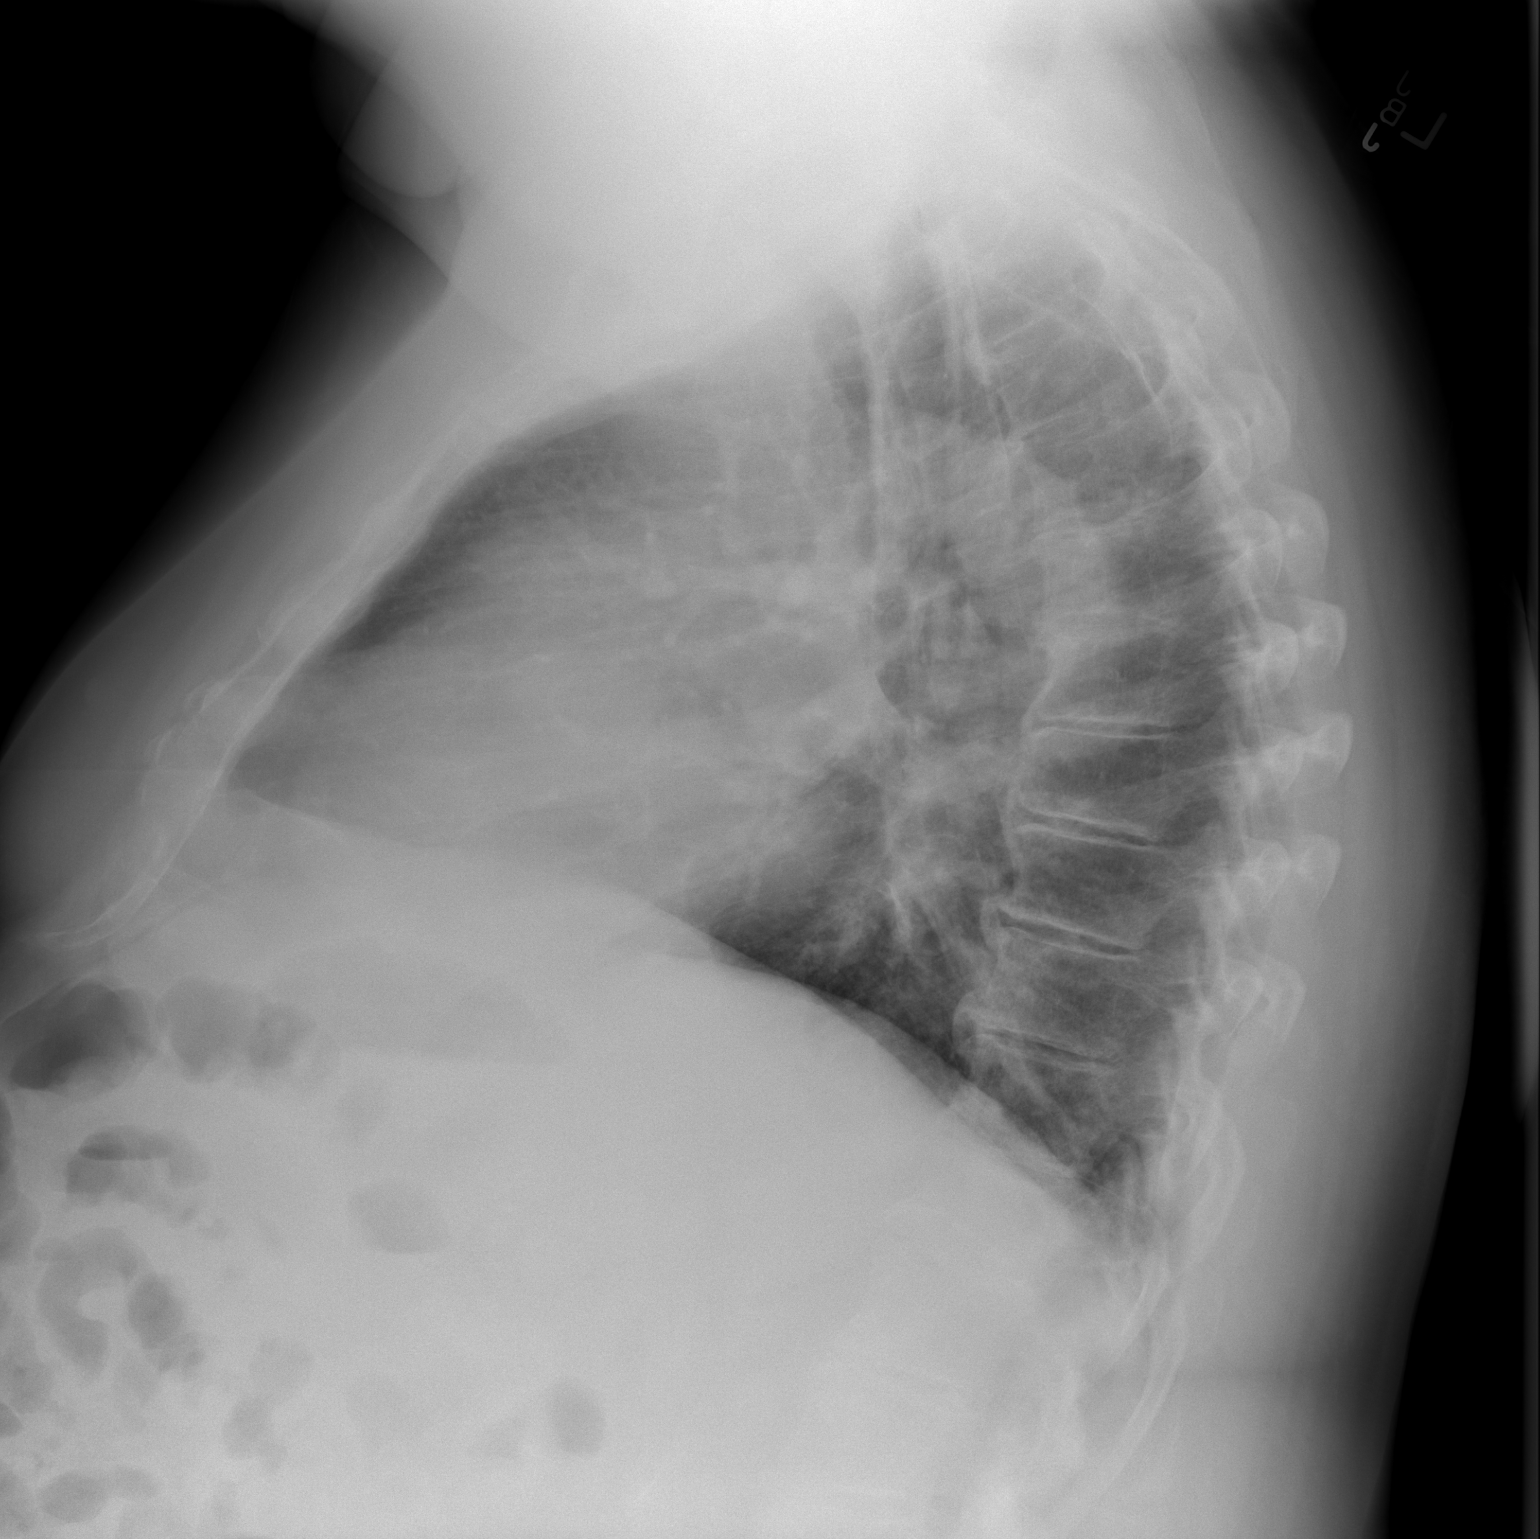

[2 of 2 positions shown; findings below may reference images not displayed]

FINDINGS: Heart and mediastinal contours are within normal limits.
No focal opacities or effusions.  No acute bony abnormality.
Degenerative changes in the thoracic spine.
IMPRESSION: No active cardiopulmonary disease.

## 2014-08-08 DIAGNOSIS — G4733 Obstructive sleep apnea (adult) (pediatric): Secondary | ICD-10-CM

## 2014-08-08 DIAGNOSIS — E7800 Pure hypercholesterolemia, unspecified: Secondary | ICD-10-CM | POA: Insufficient documentation

## 2014-08-08 DIAGNOSIS — I2510 Atherosclerotic heart disease of native coronary artery without angina pectoris: Secondary | ICD-10-CM

## 2014-08-08 HISTORY — DX: Obstructive sleep apnea (adult) (pediatric): G47.33

## 2014-08-08 HISTORY — DX: Atherosclerotic heart disease of native coronary artery without angina pectoris: I25.10

## 2014-08-08 HISTORY — DX: Pure hypercholesterolemia, unspecified: E78.00

## 2016-02-15 DIAGNOSIS — I10 Essential (primary) hypertension: Secondary | ICD-10-CM

## 2016-02-15 DIAGNOSIS — J3489 Other specified disorders of nose and nasal sinuses: Secondary | ICD-10-CM | POA: Diagnosis not present

## 2016-02-15 DIAGNOSIS — I2510 Atherosclerotic heart disease of native coronary artery without angina pectoris: Secondary | ICD-10-CM | POA: Diagnosis not present

## 2016-02-15 DIAGNOSIS — N400 Enlarged prostate without lower urinary tract symptoms: Secondary | ICD-10-CM | POA: Diagnosis not present

## 2016-02-15 DIAGNOSIS — I444 Left anterior fascicular block: Secondary | ICD-10-CM | POA: Diagnosis not present

## 2016-02-15 DIAGNOSIS — E785 Hyperlipidemia, unspecified: Secondary | ICD-10-CM | POA: Diagnosis not present

## 2016-02-15 DIAGNOSIS — E669 Obesity, unspecified: Secondary | ICD-10-CM | POA: Diagnosis not present

## 2016-02-15 DIAGNOSIS — I452 Bifascicular block: Secondary | ICD-10-CM | POA: Diagnosis not present

## 2016-02-15 DIAGNOSIS — Z7901 Long term (current) use of anticoagulants: Secondary | ICD-10-CM | POA: Diagnosis not present

## 2016-02-15 DIAGNOSIS — R0683 Snoring: Secondary | ICD-10-CM | POA: Diagnosis not present

## 2016-02-15 DIAGNOSIS — I4510 Unspecified right bundle-branch block: Secondary | ICD-10-CM | POA: Diagnosis not present

## 2016-02-15 DIAGNOSIS — Z9189 Other specified personal risk factors, not elsewhere classified: Secondary | ICD-10-CM | POA: Diagnosis not present

## 2016-02-15 DIAGNOSIS — R001 Bradycardia, unspecified: Secondary | ICD-10-CM | POA: Diagnosis not present

## 2016-02-15 DIAGNOSIS — Z885 Allergy status to narcotic agent status: Secondary | ICD-10-CM | POA: Diagnosis not present

## 2016-02-15 DIAGNOSIS — Z6841 Body Mass Index (BMI) 40.0 and over, adult: Secondary | ICD-10-CM | POA: Diagnosis not present

## 2016-02-15 HISTORY — DX: Essential (primary) hypertension: I10

## 2016-02-28 DIAGNOSIS — R001 Bradycardia, unspecified: Secondary | ICD-10-CM | POA: Diagnosis not present

## 2016-02-28 DIAGNOSIS — I998 Other disorder of circulatory system: Secondary | ICD-10-CM | POA: Diagnosis not present

## 2016-02-28 DIAGNOSIS — I4510 Unspecified right bundle-branch block: Secondary | ICD-10-CM | POA: Diagnosis not present

## 2016-02-28 DIAGNOSIS — M950 Acquired deformity of nose: Secondary | ICD-10-CM | POA: Diagnosis not present

## 2016-02-28 DIAGNOSIS — J3489 Other specified disorders of nose and nasal sinuses: Secondary | ICD-10-CM | POA: Diagnosis not present

## 2016-03-15 DIAGNOSIS — S50811A Abrasion of right forearm, initial encounter: Secondary | ICD-10-CM | POA: Diagnosis not present

## 2016-04-23 DIAGNOSIS — H2513 Age-related nuclear cataract, bilateral: Secondary | ICD-10-CM | POA: Diagnosis not present

## 2016-06-05 DIAGNOSIS — G4733 Obstructive sleep apnea (adult) (pediatric): Secondary | ICD-10-CM | POA: Diagnosis not present

## 2016-06-05 DIAGNOSIS — E7800 Pure hypercholesterolemia, unspecified: Secondary | ICD-10-CM | POA: Diagnosis not present

## 2016-06-05 DIAGNOSIS — I2510 Atherosclerotic heart disease of native coronary artery without angina pectoris: Secondary | ICD-10-CM | POA: Diagnosis not present

## 2016-06-11 DIAGNOSIS — E7800 Pure hypercholesterolemia, unspecified: Secondary | ICD-10-CM | POA: Diagnosis not present

## 2016-06-11 DIAGNOSIS — I2510 Atherosclerotic heart disease of native coronary artery without angina pectoris: Secondary | ICD-10-CM | POA: Diagnosis not present

## 2016-06-11 DIAGNOSIS — G4733 Obstructive sleep apnea (adult) (pediatric): Secondary | ICD-10-CM | POA: Diagnosis not present

## 2016-06-13 DIAGNOSIS — M950 Acquired deformity of nose: Secondary | ICD-10-CM | POA: Diagnosis not present

## 2016-07-10 ENCOUNTER — Other Ambulatory Visit: Payer: Self-pay

## 2016-07-14 ENCOUNTER — Other Ambulatory Visit: Payer: Self-pay

## 2016-07-16 ENCOUNTER — Telehealth: Payer: Self-pay | Admitting: Cardiology

## 2016-07-16 MED ORDER — CLOPIDOGREL BISULFATE 75 MG PO TABS: 75.0000 mg | ORAL_TABLET | Freq: Every day | ORAL | 0 refills | Status: DC

## 2016-07-16 NOTE — Telephone Encounter (Signed)
90 day refill sent to pharmacy as requested

## 2016-07-16 NOTE — Telephone Encounter (Signed)
Please call in Clopidigrel to the  [place] [facility].....Marland KitchenMarland KitchenHe needs more than #[phone] please!

## 2016-07-16 NOTE — Telephone Encounter (Signed)
Please send refills

## 2016-09-23 DIAGNOSIS — Z885 Allergy status to narcotic agent status: Secondary | ICD-10-CM | POA: Diagnosis not present

## 2016-09-23 DIAGNOSIS — C44222 Squamous cell carcinoma of skin of right ear and external auricular canal: Secondary | ICD-10-CM | POA: Diagnosis not present

## 2016-09-23 DIAGNOSIS — Z886 Allergy status to analgesic agent status: Secondary | ICD-10-CM | POA: Diagnosis not present

## 2016-09-23 DIAGNOSIS — Z85828 Personal history of other malignant neoplasm of skin: Secondary | ICD-10-CM | POA: Diagnosis not present

## 2016-09-23 DIAGNOSIS — K130 Diseases of lips: Secondary | ICD-10-CM | POA: Diagnosis not present

## 2016-09-23 DIAGNOSIS — L570 Actinic keratosis: Secondary | ICD-10-CM | POA: Diagnosis not present

## 2016-09-23 DIAGNOSIS — Z9889 Other specified postprocedural states: Secondary | ICD-10-CM | POA: Diagnosis not present

## 2016-09-23 DIAGNOSIS — L821 Other seborrheic keratosis: Secondary | ICD-10-CM | POA: Diagnosis not present

## 2016-10-02 DIAGNOSIS — R1310 Dysphagia, unspecified: Secondary | ICD-10-CM | POA: Diagnosis not present

## 2016-10-02 DIAGNOSIS — Z86010 Personal history of colonic polyps: Secondary | ICD-10-CM | POA: Diagnosis not present

## 2016-10-13 DIAGNOSIS — K219 Gastro-esophageal reflux disease without esophagitis: Secondary | ICD-10-CM | POA: Diagnosis not present

## 2016-10-13 DIAGNOSIS — K222 Esophageal obstruction: Secondary | ICD-10-CM | POA: Diagnosis not present

## 2016-10-13 DIAGNOSIS — Z86010 Personal history of colonic polyps: Secondary | ICD-10-CM | POA: Diagnosis not present

## 2016-10-13 DIAGNOSIS — R1310 Dysphagia, unspecified: Secondary | ICD-10-CM | POA: Diagnosis not present

## 2016-10-13 DIAGNOSIS — Z1211 Encounter for screening for malignant neoplasm of colon: Secondary | ICD-10-CM | POA: Diagnosis not present

## 2016-10-15 ENCOUNTER — Ambulatory Visit (INDEPENDENT_AMBULATORY_CARE_PROVIDER_SITE_OTHER): Payer: Medicare HMO | Admitting: Cardiology

## 2016-10-15 ENCOUNTER — Encounter: Payer: Self-pay | Admitting: Cardiology

## 2016-10-15 VITALS — BP 120/62 | HR 80 | Resp 14 | Ht 71.0 in | Wt 286.0 lb

## 2016-10-15 DIAGNOSIS — I10 Essential (primary) hypertension: Secondary | ICD-10-CM

## 2016-10-15 DIAGNOSIS — G4733 Obstructive sleep apnea (adult) (pediatric): Secondary | ICD-10-CM | POA: Diagnosis not present

## 2016-10-15 DIAGNOSIS — R001 Bradycardia, unspecified: Secondary | ICD-10-CM

## 2016-10-15 DIAGNOSIS — I2510 Atherosclerotic heart disease of native coronary artery without angina pectoris: Secondary | ICD-10-CM

## 2016-10-15 DIAGNOSIS — E7800 Pure hypercholesterolemia, unspecified: Secondary | ICD-10-CM | POA: Diagnosis not present

## 2016-10-15 MED ORDER — RANOLAZINE ER 500 MG PO TB12: 500.0000 mg | ORAL_TABLET | Freq: Two times a day (BID) | ORAL | 3 refills | Status: DC

## 2016-10-15 MED ORDER — METOPROLOL TARTRATE 25 MG PO TABS: 12.5000 mg | ORAL_TABLET | Freq: Two times a day (BID) | ORAL | 6 refills | Status: DC

## 2016-10-15 NOTE — Patient Instructions (Addendum)
Medication Instructions:  Your physician has recommended you make the following change in your medication:  1.) DECREASE Metoprolol to 12.5 mg twice daily. (1/2 tablet twice daily) A new prescriptions has been sent.   1. Avoid all over-the-counter antihistamines except Claritin/Loratadine and Zyrtec/Cetrizine. 2. Avoid all combination including cold sinus allergies flu decongestant and sleep medications 3. You can use Robitussin DM Mucinex and Mucinex DM for cough. 4. can use Tylenol aspirin ibuprofen and naproxen but no combinations such as sleep or sinus.    Labwork: None    Testing/Procedures: EKG today in office.   Your physician has requested that you have an echocardiogram. Echocardiography is a painless test that uses sound waves to create images of your heart. It provides your doctor with information about the size and shape of your heart and how well your heart's chambers and valves are working. This procedure takes approximately one hour. There are no restrictions for this procedure.  Your physician has recommended that you wear a holter monitor. Holter monitors are medical devices that record the heart's electrical activity. Doctors most often use these monitors to diagnose arrhythmias. Arrhythmias are problems with the speed or rhythm of the heartbeat. The monitor is a small, portable device. You can wear one while you do your normal daily activities. This is usually used to diagnose what is causing palpitations/syncope (passing out).    Follow-Up: Your physician recommends that you schedule a follow-up appointment in: 2 months   Any Other Special Instructions Will Be Listed Below (If Applicable).  Please note that any paperwork needing to be filled out by the provider will need to be addressed at the front desk prior to seeing the provider. Please note that any paperwork FMLA, Disability or other documents regarding health condition is subject to a $25.00 charge that must be  received prior to completion of paperwork in the form of a money order or check.     If you need a refill on your cardiac medications before your next appointment, please call your pharmacy.

## 2016-10-15 NOTE — Progress Notes (Signed)
Cardiology Office Note:    Date:  10/15/2016   ID:  Anthony Perkins, DOB 22-Sep-1940, MRN [id number]  PCP:  Myrlene Broker, MD  Cardiologist:  Jenne Campus, MD    Referring MD: Myrlene Broker, MD   Anthony Complaint  Patient presents with  . Follow-up  Just regular follow-up  History of Present Illness:    Anthony Perkins is a 76 y.o. male  with coronary artery disease status post PTCA and stenting in 2016, comes for regular follow-up. Denies having any chest pain tightness squeezing pressure pain chest. Exertional shortness of breath is there. He recently got a gastroscopy and colonoscopy everything went well. He is also scheduled to have some ear surgery which will be done under sedation. From my point of view there is no restriction for the Surgery however we need to pay attention to the fact that he does have significant sleep apnea which is not managed because he refused to be tested for it. Cardiac-wise seems to be doing well.  Past Medical History:  Diagnosis Date  . Arthritis KNEES  . Coronary artery disease   . History of cellulitis of skin with lymphangitis 2004     STATES WEARS TED HOSE DAILY--  NO SWELLING BUT SKIN IS RED  SINCE 2004 EPISODE  . History of urinary tract obstruction SECONDARY BPH  . Hyperlipidemia   . Nocturia   . OSA (obstructive sleep apnea)   . Prostate cancer ([place]) 12/20/10   gleason 6, vol 42 cc  . PSA elevation     Past Surgical History:  Procedure Laterality Date  . CARDIAC CATHETERIZATION    . CYSTOSCOPY  07/30/2011   Procedure: CYSTOSCOPY FLEXIBLE;  Surgeon: Joie Bimler, MD;  [facility];  Service: Urology;  Laterality: N/A;  no seeds found in bladder  . MELANOMA EXCISION  1997   LEFT EAR  . PROSTATE BIOPSY  12/20/10  DR Nila Nephew 'S OFFICE   Gleason 6, vol 42 cc  . RADIOACTIVE SEED IMPLANT  07/30/2011   Procedure: RADIOACTIVE SEED IMPLANT;  Surgeon: Joie Bimler, MD;  Location: [facility];   Service: Urology;  Laterality: N/A;  64 seeds implanted  . ROTATOR CUFF REPAIR  NOV 2011   RIGHT SHOULDER  . TONSILLECTOMY AND ADENOIDECTOMY  1948    Current Medications: Current Meds  Medication Sig  . aspirin EC 81 MG tablet Take 81 mg by mouth daily.  Marland Kitchen atorvastatin (LIPITOR) 80 MG tablet Take 1 tablet by mouth daily.  . clopidogrel (PLAVIX) 75 MG tablet Take 1 tablet (75 mg total) by mouth daily.  . finasteride (PROSCAR) 5 MG tablet Take 5 mg by mouth daily.  Marland Kitchen lisinopril (PRINIVIL,ZESTRIL) 2.5 MG tablet Take 1 tablet by mouth daily.  . metoprolol tartrate (LOPRESSOR) 25 MG tablet Take 1 tablet by mouth 2 (two) times daily.  . nitroGLYCERIN (NITROSTAT) 0.4 MG SL tablet Place 0.4 mg under the tongue as needed for chest pain.  . ranolazine (RANEXA) 500 MG 12 hr tablet TAKE 1 TABLET BY MOUTH TWICE DAILY FOR 7 DAYS  . tamsulosin (FLOMAX) 0.4 MG CAPS capsule Take 0.4 mg by mouth daily.     Allergies:   Hydrocodone   Social History   Social History  . Marital status: Married    Spouse name: N/A  . Number of children: N/A  . Years of education: N/A   Social History Main Topics  . Smoking status: Never Smoker  . Smokeless tobacco: Never Used  .  Alcohol use No  . Drug use: No  . Sexual activity: Not Asked   Other Topics Concern  . None   Social History Narrative   Married, 3 children, truck driver              Family History: The patient's family history includes Cancer in his father and paternal grandmother. ROS:   Please see the history of present illness.    All 14 point review of systems negative except as described per history of present illness  EKGs/Labs/Other Studies Reviewed:      Recent Labs: No results found for requested labs within last 8760 hours.  Recent Lipid Panel No results found for: CHOL, TRIG, HDL, CHOLHDL, VLDL, LDLCALC, LDLDIRECT  Physical Exam:    VS:  BP 120/62   Pulse 80   Resp 14   Ht 5\' 11"  (1.803 m)   Wt 286 lb (129.7 kg)    BMI 39.89 kg/m     Wt Readings from Last 3 Encounters:  10/15/16 286 lb (129.7 kg)  08/26/11 (!) 302 lb 1.6 oz (137 kg)  07/23/11 (!) 305 lb (138.3 kg)     GEN:  Well nourished, well developed in no acute distress HEENT: Normal NECK: No JVD; No carotid bruits LYMPHATICS: No lymphadenopathy CARDIAC: RRR, no murmurs, no rubs, no gallops RESPIRATORY:  Clear to auscultation without rales, wheezing or rhonchi  ABDOMEN: Soft, non-tender, non-distended MUSCULOSKELETAL:  No edema; No deformity  SKIN: Warm and dry LOWER EXTREMITIES: no swelling NEUROLOGIC:  Alert and oriented x 3 PSYCHIATRIC:  Normal affect   ASSESSMENT:    1. Coronary artery disease involving native coronary artery of native heart without angina pectoris   2. Essential hypertension   3. Hypercholesterolemia   4. Obstructive sleep apnea syndrome    PLAN:    In order of problems listed above:  1. Coronary artery disease: Asymptomatic, on appropriate medications which I will continue. 2. Essential hypertension: Blood pressure is well-controlled which I will continue present management. 3. Dyslipidemia: We'll schedule him to have fasting lipid profile done. 4. Obstructive sleep apnea: Refuse to be tested for it which is obviously a problem I spoke to him again still reluctant. 5. Bradycardiac: Apparently every time he goes to Dr. he is finding to have slow heart rate today in our office 80 I will ask him to have EKG and then in the future he most likely will require Holter monitor.   Medication Adjustments/Labs and Tests Ordered: Current medicines are reviewed at length with the patient today.  Concerns regarding medicines are outlined above.  No orders of the defined types were placed in this encounter.  Medication changes: No orders of the defined types were placed in this encounter.   Signed, Park Liter, MD, [facility] 10/15/2016 10:07 AM    [place]

## 2016-11-12 ENCOUNTER — Other Ambulatory Visit (HOSPITAL_BASED_OUTPATIENT_CLINIC_OR_DEPARTMENT_OTHER): Payer: Self-pay

## 2016-11-18 ENCOUNTER — Telehealth: Payer: Self-pay | Admitting: Cardiology

## 2016-11-18 MED ORDER — ATORVASTATIN CALCIUM 80 MG PO TABS: 80.0000 mg | ORAL_TABLET | Freq: Every day | ORAL | 3 refills | Status: DC

## 2016-11-18 NOTE — Telephone Encounter (Signed)
°*  STAT* If patient is at the pharmacy, call can be transferred to refill team.   1. Which medications need to be refilled? (please list name of each medication and dose if known) Atorvastatin 80mg  1 dailey   2. Which pharmacy/location (including street and city if local pharmacy) is medication to be sent to?[facility] [address]. Do they need a 30 day or 90 day supply? [place]

## 2016-11-18 NOTE — Telephone Encounter (Signed)
90 supply has been sent to pharmacy.

## 2016-12-02 ENCOUNTER — Ambulatory Visit: Payer: Medicare HMO

## 2016-12-02 ENCOUNTER — Ambulatory Visit (HOSPITAL_BASED_OUTPATIENT_CLINIC_OR_DEPARTMENT_OTHER)
Admission: RE | Admit: 2016-12-02 | Discharge: 2016-12-02 | Disposition: A | Discharge status: Home / Self Care | Payer: Medicare HMO | Source: Ambulatory Visit | Attending: Cardiology | Admitting: Cardiology

## 2016-12-02 DIAGNOSIS — E785 Hyperlipidemia, unspecified: Secondary | ICD-10-CM | POA: Insufficient documentation

## 2016-12-02 DIAGNOSIS — I080 Rheumatic disorders of both mitral and aortic valves: Secondary | ICD-10-CM | POA: Insufficient documentation

## 2016-12-02 DIAGNOSIS — R001 Bradycardia, unspecified: Secondary | ICD-10-CM | POA: Diagnosis not present

## 2016-12-02 DIAGNOSIS — I119 Hypertensive heart disease without heart failure: Secondary | ICD-10-CM | POA: Diagnosis not present

## 2016-12-02 DIAGNOSIS — I2510 Atherosclerotic heart disease of native coronary artery without angina pectoris: Secondary | ICD-10-CM

## 2016-12-02 MED ORDER — PERFLUTREN LIPID MICROSPHERE
1.0000 mL | INTRAVENOUS | Status: AC | PRN
  Administered 2016-12-02: 5 mL via INTRAVENOUS
  Filled 2016-12-02: qty 10

## 2016-12-02 NOTE — Progress Notes (Signed)
Echocardiogram 2D Echocardiogram with definity has been performed.  Joelene Millin 12/02/2016, 11:52 AM

## 2016-12-15 ENCOUNTER — Ambulatory Visit: Payer: Self-pay | Admitting: Cardiology

## 2016-12-23 ENCOUNTER — Ambulatory Visit: Payer: Medicare HMO | Admitting: Cardiology

## 2017-01-13 ENCOUNTER — Ambulatory Visit: Payer: Medicare HMO | Admitting: Cardiology

## 2017-01-13 ENCOUNTER — Other Ambulatory Visit: Payer: Self-pay

## 2017-01-13 ENCOUNTER — Encounter: Payer: Self-pay | Admitting: Cardiology

## 2017-01-13 VITALS — BP 122/74 | HR 53 | Ht 71.0 in | Wt 274.1 lb

## 2017-01-13 DIAGNOSIS — I10 Essential (primary) hypertension: Secondary | ICD-10-CM | POA: Diagnosis not present

## 2017-01-13 DIAGNOSIS — M6250 Muscle wasting and atrophy, not elsewhere classified, unspecified site: Secondary | ICD-10-CM

## 2017-01-13 DIAGNOSIS — E7800 Pure hypercholesterolemia, unspecified: Secondary | ICD-10-CM

## 2017-01-13 DIAGNOSIS — G4733 Obstructive sleep apnea (adult) (pediatric): Secondary | ICD-10-CM

## 2017-01-13 DIAGNOSIS — I2510 Atherosclerotic heart disease of native coronary artery without angina pectoris: Secondary | ICD-10-CM

## 2017-01-13 NOTE — Progress Notes (Signed)
Cardiology Office Note:    Date:  01/13/2017   ID:  Anthony Perkins, DOB [birth year], MRN [id number]  PCP:  Anthony Broker, MD  Cardiologist:  Anthony Campus, MD    Referring MD: Anthony Broker, MD   Chief Complaint  Patient presents with  . Coronary Artery Disease  Doing well denies have any chest pain tightness squeezing pressure burning chest  History of Present Illness:    Anthony Perkins is a 77 y.o. male coronary artery disease doing great asymptomatic no chest pain tightness squeezing pressure burning chest  Past Medical History:  Diagnosis Date  . Arthritis KNEES  . Coronary artery disease   . History of cellulitis of skin with lymphangitis 2004     STATES WEARS TED HOSE DAILY--  NO SWELLING BUT SKIN IS RED  SINCE 2004 EPISODE  . History of urinary tract obstruction SECONDARY BPH  . Hyperlipidemia   . Nocturia   . OSA (obstructive sleep apnea)   . Prostate cancer (Anthony Perkins) 12/20/10   gleason 6, vol 42 cc  . PSA elevation     Past Surgical History:  Procedure Laterality Date  . CARDIAC CATHETERIZATION    . CYSTOSCOPY  07/30/2011   Procedure: CYSTOSCOPY FLEXIBLE;  Surgeon: Anthony Bimler, MD;  Location[facility];  Service: Urology;  Laterality: N/A;  no seeds found in bladder  . MELANOMA EXCISION  1997   LEFT EAR  . PROSTATE BIOPSY  12/20/10  DR Anthony Perkins 'S OFFICE   Gleason 6, vol 42 cc  . RADIOACTIVE SEED IMPLANT  07/30/2011   Procedure: RADIOACTIVE SEED IMPLANT;  Surgeon: Anthony Bimler, MD;  Location: [facility];  Service: Urology;  Laterality: N/A;  64 seeds implanted  . ROTATOR CUFF REPAIR  NOV 2011   RIGHT SHOULDER  . TONSILLECTOMY AND ADENOIDECTOMY  1948    Current Medications: Current Meds  Medication Sig  . aspirin EC 81 MG tablet Take 81 mg by mouth daily.  Marland Kitchen atorvastatin (LIPITOR) 80 MG tablet Take 1 tablet (80 mg total) daily by mouth.  . clopidogrel (PLAVIX) 75 MG tablet Take 1 tablet (75 mg total) by mouth daily.  .  finasteride (PROSCAR) 5 MG tablet Take 5 mg by mouth daily.  Marland Kitchen lisinopril (PRINIVIL,ZESTRIL) 2.5 MG tablet Take 1 tablet by mouth daily.  . metoprolol tartrate (LOPRESSOR) 25 MG tablet Take 0.5 tablets (12.5 mg total) by mouth 2 (two) times daily.  . nitroGLYCERIN (NITROSTAT) 0.4 MG SL tablet Place 0.4 mg under the tongue as needed for chest pain.  . ranolazine (RANEXA) 500 MG 12 hr tablet Take 1 tablet (500 mg total) by mouth 2 (two) times daily.  . tamsulosin (FLOMAX) 0.4 MG CAPS capsule Take 0.4 mg by mouth daily.     Allergies:   Hydrocodone   Social History   Socioeconomic History  . Marital status: Married    Spouse name: None  . Number of children: None  . Years of education: None  . Highest education level: None  Social Needs  . Financial resource strain: None  . Food insecurity - worry: None  . Food insecurity - inability: None  . Transportation needs - medical: None  . Transportation needs - non-medical: None  Occupational History  . None  Tobacco Use  . Smoking status: Never Smoker  . Smokeless tobacco: Never Used  Substance and Sexual Activity  . Alcohol use: No  . Drug use: No  . Sexual activity: None  Other Topics Concern  .  None  Social History Narrative   Married, 3 children, truck driver              Family History: The patient's family history includes Cancer in his father and paternal grandmother. ROS:   Please see the history of present illness.    All 14 point review of systems negative except as described per history of present illness  EKGs/Labs/Other Studies Reviewed:      Recent Labs: No results found for requested labs within last 8760 hours.  Recent Lipid Panel No results found for: CHOL, TRIG, HDL, CHOLHDL, VLDL, LDLCALC, LDLDIRECT  Physical Exam:    VS:  BP 122/74 (BP Location: Right Arm, Patient Position: Sitting, Cuff Size: Large)   Pulse (!) 53   Ht 5\' 11"  (1.803 m)   Wt 274 lb 1.9 oz (124.3 kg)   SpO2 95%   BMI 38.23  kg/m     Wt Readings from Last 3 Encounters:  01/13/17 274 lb 1.9 oz (124.3 kg)  10/15/16 286 lb (129.7 kg)  08/26/11 (!) 302 lb 1.6 oz (137 kg)     GEN:  Well nourished, well developed in no acute distress HEENT: Normal NECK: No JVD; No carotid bruits LYMPHATICS: No lymphadenopathy CARDIAC: RRR, no murmurs, no rubs, no gallops RESPIRATORY:  Clear to auscultation without rales, wheezing or rhonchi  ABDOMEN: Soft, non-tender, non-distended MUSCULOSKELETAL:  No edema; No deformity  SKIN: Warm and dry LOWER EXTREMITIES: no swelling NEUROLOGIC:  Alert and oriented x 3 PSYCHIATRIC:  Normal affect   ASSESSMENT:    1. Coronary artery disease involving native coronary artery of native heart without angina pectoris   2. Essential hypertension   3. Obstructive sleep apnea syndrome   4. Hypercholesterolemia    PLAN:    In order of problems listed above:  1. Coronary artery disease: Doing well from that point of view asymptomatic we will continue present management. 2. Essential hypertension: Blood pressure well controlled continue management 3. Sleep apnea: Doing well follow-up by internal medicine team. 4. Dyslipidemia: Check fasting lipid profile today.   Medication Adjustments/Labs and Tests Ordered: Current medicines are reviewed at length with the patient today.  Concerns regarding medicines are outlined above.  No orders of the defined types were placed in this encounter.  Medication changes: No orders of the defined types were placed in this encounter.   Signed, Anthony Liter, MD, [facility] 01/13/2017 9:57 AM    [place]

## 2017-01-13 NOTE — Patient Instructions (Signed)
Medication Instructions:  Your physician recommends that you continue on your current medications as directed. Please refer to the Current Medication list given to you today.  Labwork: Your physician recommends that you have the following labs drawn: lipid panel  Testing/Procedures: None  Follow-Up: Your physician recommends that you schedule a follow-up appointment in: 6 months  Any Other Special Instructions Will Be Listed Below (If Applicable).     If you need a refill on your cardiac medications before your next appointment, please call your pharmacy.   [facility]  Ashley A, RN, BSN  

## 2017-01-14 ENCOUNTER — Ambulatory Visit: Payer: Medicare HMO | Admitting: Cardiology

## 2017-01-14 LAB — LIPID PANEL
CHOL/HDL RATIO: 3.1 ratio (ref 0.0–5.0)
CHOLESTEROL TOTAL: 120 mg/dL (ref 100–199)
HDL: 39 mg/dL — AB (ref >39)
LDL Calculated: 58 mg/dL (ref 0–99)
Triglycerides: 116 mg/dL (ref 0–149)
VLDL Cholesterol Cal: 23 mg/dL (ref 5–40)

## 2017-01-14 LAB — TESTOSTERONE: Testosterone: 551 ng/dL (ref 264–916)

## 2017-02-13 DIAGNOSIS — I9589 Other hypotension: Secondary | ICD-10-CM | POA: Diagnosis not present

## 2017-02-13 DIAGNOSIS — Z86010 Personal history of colonic polyps: Secondary | ICD-10-CM | POA: Diagnosis not present

## 2017-02-13 DIAGNOSIS — R578 Other shock: Secondary | ICD-10-CM | POA: Diagnosis not present

## 2017-02-13 DIAGNOSIS — D500 Iron deficiency anemia secondary to blood loss (chronic): Secondary | ICD-10-CM | POA: Diagnosis not present

## 2017-02-13 DIAGNOSIS — I2510 Atherosclerotic heart disease of native coronary artery without angina pectoris: Secondary | ICD-10-CM | POA: Diagnosis not present

## 2017-02-13 DIAGNOSIS — K2960 Other gastritis without bleeding: Secondary | ICD-10-CM | POA: Diagnosis not present

## 2017-02-13 DIAGNOSIS — R571 Hypovolemic shock: Secondary | ICD-10-CM | POA: Diagnosis not present

## 2017-02-13 DIAGNOSIS — K209 Esophagitis, unspecified: Secondary | ICD-10-CM | POA: Diagnosis not present

## 2017-02-13 DIAGNOSIS — K5730 Diverticulosis of large intestine without perforation or abscess without bleeding: Secondary | ICD-10-CM | POA: Diagnosis not present

## 2017-02-13 DIAGNOSIS — K921 Melena: Secondary | ICD-10-CM | POA: Diagnosis not present

## 2017-02-13 DIAGNOSIS — K5710 Diverticulosis of small intestine without perforation or abscess without bleeding: Secondary | ICD-10-CM | POA: Diagnosis not present

## 2017-02-13 DIAGNOSIS — K2980 Duodenitis without bleeding: Secondary | ICD-10-CM | POA: Diagnosis not present

## 2017-02-13 DIAGNOSIS — K922 Gastrointestinal hemorrhage, unspecified: Secondary | ICD-10-CM | POA: Diagnosis not present

## 2017-02-13 DIAGNOSIS — I10 Essential (primary) hypertension: Secondary | ICD-10-CM | POA: Diagnosis not present

## 2017-02-13 DIAGNOSIS — D62 Acute posthemorrhagic anemia: Secondary | ICD-10-CM | POA: Diagnosis not present

## 2017-02-14 DIAGNOSIS — R578 Other shock: Secondary | ICD-10-CM | POA: Diagnosis not present

## 2017-02-14 DIAGNOSIS — K922 Gastrointestinal hemorrhage, unspecified: Secondary | ICD-10-CM | POA: Diagnosis not present

## 2017-02-14 DIAGNOSIS — D500 Iron deficiency anemia secondary to blood loss (chronic): Secondary | ICD-10-CM | POA: Diagnosis not present

## 2017-02-14 DIAGNOSIS — I2510 Atherosclerotic heart disease of native coronary artery without angina pectoris: Secondary | ICD-10-CM | POA: Diagnosis not present

## 2017-02-14 DIAGNOSIS — I10 Essential (primary) hypertension: Secondary | ICD-10-CM | POA: Diagnosis not present

## 2017-02-14 DIAGNOSIS — K921 Melena: Secondary | ICD-10-CM | POA: Diagnosis not present

## 2017-02-15 DIAGNOSIS — I2510 Atherosclerotic heart disease of native coronary artery without angina pectoris: Secondary | ICD-10-CM | POA: Diagnosis not present

## 2017-02-15 DIAGNOSIS — K2950 Unspecified chronic gastritis without bleeding: Secondary | ICD-10-CM | POA: Diagnosis not present

## 2017-02-15 DIAGNOSIS — K922 Gastrointestinal hemorrhage, unspecified: Secondary | ICD-10-CM | POA: Diagnosis not present

## 2017-02-15 DIAGNOSIS — D500 Iron deficiency anemia secondary to blood loss (chronic): Secondary | ICD-10-CM | POA: Diagnosis not present

## 2017-02-15 DIAGNOSIS — K2960 Other gastritis without bleeding: Secondary | ICD-10-CM | POA: Diagnosis not present

## 2017-02-15 DIAGNOSIS — K209 Esophagitis, unspecified: Secondary | ICD-10-CM | POA: Diagnosis not present

## 2017-02-15 DIAGNOSIS — K2980 Duodenitis without bleeding: Secondary | ICD-10-CM | POA: Diagnosis not present

## 2017-02-15 DIAGNOSIS — I10 Essential (primary) hypertension: Secondary | ICD-10-CM | POA: Diagnosis not present

## 2017-02-15 DIAGNOSIS — K5730 Diverticulosis of large intestine without perforation or abscess without bleeding: Secondary | ICD-10-CM | POA: Diagnosis not present

## 2017-02-23 DIAGNOSIS — K625 Hemorrhage of anus and rectum: Secondary | ICD-10-CM | POA: Diagnosis not present

## 2017-03-10 DIAGNOSIS — K2960 Other gastritis without bleeding: Secondary | ICD-10-CM | POA: Diagnosis not present

## 2017-03-10 DIAGNOSIS — K5731 Diverticulosis of large intestine without perforation or abscess with bleeding: Secondary | ICD-10-CM | POA: Diagnosis not present

## 2017-03-10 DIAGNOSIS — Z86010 Personal history of colonic polyps: Secondary | ICD-10-CM | POA: Diagnosis not present

## 2017-03-23 ENCOUNTER — Telehealth: Payer: Self-pay | Admitting: Cardiology

## 2017-03-23 NOTE — Telephone Encounter (Signed)
°*  STAT* If patient is at the pharmacy, call can be transferred to refill team.   1. Which medications need to be refilled? (please list name of each medication and dose if known) Clopidigrel 75mg  takes 1 daily   2. Which pharmacy/location (including street and city if local pharmacy) is medication to be sent to [place]   3. Do they need a 30 day or 90 day supply? 90    *STAT* If patient is at the pharmacy, call can be transferred to refill team.   1. Which medications need to be refilled? (please list name of each medication and dose if known) Atorvastatin 80mg  takes 1 daily   2. Which pharmacy/location (including street and city if local pharmacy) is [facility]? [facility] [address]  3. Do they need a 30 day or 90 day supply? [place]

## 2017-03-24 MED ORDER — CLOPIDOGREL BISULFATE 75 MG PO TABS: 75.0000 mg | ORAL_TABLET | Freq: Every day | ORAL | 2 refills | Status: DC

## 2017-03-24 NOTE — Telephone Encounter (Signed)
Refills sent

## 2017-04-27 ENCOUNTER — Telehealth: Payer: Self-pay | Admitting: Cardiology

## 2017-04-27 MED ORDER — ATORVASTATIN CALCIUM 80 MG PO TABS: 80.0000 mg | ORAL_TABLET | Freq: Every day | ORAL | 1 refills | Status: DC

## 2017-04-27 NOTE — Telephone Encounter (Signed)
[facility] [place]  

## 2017-04-27 NOTE — Telephone Encounter (Signed)
°*  STAT* If patient is at the pharmacy, call can be transferred to refill team.   1. Which medications need to be refilled? (please list name of each medication and dose if known) Atorvastatin 80mg  1 daily  2. Which pharmacy/location (including street and city if local pharmacy) is medication to be sent to? [facility] [address]  3. Do they need a 30 day or 90 day supply?[place]

## 2017-05-01 DIAGNOSIS — K5731 Diverticulosis of large intestine without perforation or abscess with bleeding: Secondary | ICD-10-CM | POA: Diagnosis not present

## 2017-05-12 DIAGNOSIS — K5731 Diverticulosis of large intestine without perforation or abscess with bleeding: Secondary | ICD-10-CM | POA: Diagnosis not present

## 2017-07-08 DIAGNOSIS — K921 Melena: Secondary | ICD-10-CM | POA: Diagnosis not present

## 2017-07-08 DIAGNOSIS — Z1211 Encounter for screening for malignant neoplasm of colon: Secondary | ICD-10-CM | POA: Diagnosis not present

## 2017-07-08 DIAGNOSIS — Z86010 Personal history of colonic polyps: Secondary | ICD-10-CM | POA: Diagnosis not present

## 2017-10-09 ENCOUNTER — Encounter

## 2017-10-09 ENCOUNTER — Ambulatory Visit: Payer: Medicare HMO | Admitting: Cardiology

## 2017-10-26 ENCOUNTER — Telehealth: Payer: Self-pay | Admitting: Cardiology

## 2017-10-26 MED ORDER — ATORVASTATIN CALCIUM 80 MG PO TABS
80.0000 mg | ORAL_TABLET | Freq: Every day | ORAL | 0 refills | Status: DC
Start: 2017-10-26 — End: 2018-01-18

## 2017-10-26 NOTE — Telephone Encounter (Signed)
Atorvastatin 80mg refilled  

## 2017-10-26 NOTE — Telephone Encounter (Signed)
°*  STAT* If patient is at the pharmacy, call can be transferred to refill team.   1. Which medications need to be refilled? (please list name of each medication and dose if known) Atorvastatin takes 1 daily   2. Which pharmacy/location (including street and city if local pharmacy) is [facility] [address] [facility] [place]  3. Do they need a 30 day or 90 day supply? 90  patient is out!!!

## 2017-11-04 ENCOUNTER — Ambulatory Visit: Payer: Medicare HMO | Admitting: Cardiology

## 2017-11-09 DIAGNOSIS — R351 Nocturia: Secondary | ICD-10-CM | POA: Diagnosis not present

## 2017-11-09 DIAGNOSIS — C61 Malignant neoplasm of prostate: Secondary | ICD-10-CM | POA: Diagnosis not present

## 2017-11-19 ENCOUNTER — Other Ambulatory Visit: Payer: Self-pay | Admitting: Emergency Medicine

## 2017-11-19 MED ORDER — METOPROLOL TARTRATE 25 MG PO TABS: 12.5000 mg | ORAL_TABLET | Freq: Two times a day (BID) | ORAL | 1 refills | Status: DC

## 2017-11-19 NOTE — Telephone Encounter (Signed)
Metoprolol tartrate 25 mg 1/ tablet twice daily refilled

## 2017-11-24 ENCOUNTER — Other Ambulatory Visit: Payer: Self-pay | Admitting: Emergency Medicine

## 2017-11-24 DIAGNOSIS — I2510 Atherosclerotic heart disease of native coronary artery without angina pectoris: Secondary | ICD-10-CM

## 2017-11-24 NOTE — Telephone Encounter (Signed)
Refilled ranexa 500 mg twice daily for patient via [facility] via fax.

## 2017-11-26 ENCOUNTER — Ambulatory Visit: Payer: Medicare HMO | Admitting: Cardiology

## 2017-12-01 ENCOUNTER — Telehealth: Payer: Self-pay | Admitting: Cardiology

## 2017-12-01 MED ORDER — LISINOPRIL 2.5 MG PO TABS: 2.5000 mg | ORAL_TABLET | Freq: Every day | ORAL | 0 refills | Status: DC

## 2017-12-01 NOTE — Telephone Encounter (Signed)
°*  STAT* If patient is at the pharmacy, call can be transferred to refill team.   1. Which medications need to be refilled? (please list name of each medication and dose if known) Lisinipril 1 daily   2. Which pharmacy/location (including street and city if local pharmacy) is medication to be sent to? [facility] [address]  3. Do they need a 30 day or 90 day supply? [place]

## 2017-12-01 NOTE — Telephone Encounter (Signed)
Lisinopril 2.5 mg daily refilled.  

## 2017-12-12 ENCOUNTER — Other Ambulatory Visit: Payer: Self-pay | Admitting: Cardiology

## 2017-12-21 ENCOUNTER — Encounter: Payer: Self-pay | Admitting: Cardiology

## 2017-12-21 ENCOUNTER — Ambulatory Visit: Payer: Medicare HMO | Admitting: Cardiology

## 2017-12-21 VITALS — BP 120/62 | HR 59 | Ht 71.0 in | Wt 288.2 lb

## 2017-12-21 DIAGNOSIS — E7800 Pure hypercholesterolemia, unspecified: Secondary | ICD-10-CM

## 2017-12-21 DIAGNOSIS — I10 Essential (primary) hypertension: Secondary | ICD-10-CM | POA: Diagnosis not present

## 2017-12-21 DIAGNOSIS — I2510 Atherosclerotic heart disease of native coronary artery without angina pectoris: Secondary | ICD-10-CM

## 2017-12-21 NOTE — Progress Notes (Signed)
Cardiology Office Note:    Date:  12/21/2017   ID:  Anthony Perkins, DOB Feb 08, 1940, MRN [id number]  PCP:  Myrlene Broker, MD  Cardiologist:  Jenne Campus, MD    Referring MD: Myrlene Broker, MD   Chief Complaint  Patient presents with  . Follow-up  Doing well cardiac wise  History of Present Illness:    Anthony Perkins is a 77 y.o. male with coronary disease status post PTCA and stenting of circumflex artery in 2016.  Doing well denies have any chest pain tightness squeezing pressure burning chest still trying to be active get tired and short of breath quite easily.  Past Medical History:  Diagnosis Date  . Arthritis KNEES  . Coronary artery disease   . History of cellulitis of skin with lymphangitis 2004     STATES WEARS TED HOSE DAILY--  NO SWELLING BUT SKIN IS RED  SINCE 2004 EPISODE  . History of urinary tract obstruction SECONDARY BPH  . Hyperlipidemia   . Nocturia   . OSA (obstructive sleep apnea)   . Prostate cancer ([place]) 12/20/10   gleason 6, vol 42 cc  . PSA elevation     Past Surgical History:  Procedure Laterality Date  . CARDIAC CATHETERIZATION    . CYSTOSCOPY  07/30/2011   Procedure: CYSTOSCOPY FLEXIBLE;  Surgeon: Joie Bimler, MD;  Location: [facility];  Service: Urology;  Laterality: N/A;  no seeds found in bladder  . MELANOMA EXCISION  1997   LEFT EAR  . PROSTATE BIOPSY  12/20/10  DR Nila Nephew 'S OFFICE   Gleason 6, vol 42 cc  . RADIOACTIVE SEED IMPLANT  07/30/2011   Procedure: RADIOACTIVE SEED IMPLANT;  Surgeon: Joie Bimler, MD;  Location: [facility];  Service: Urology;  Laterality: N/A;  64 seeds implanted  . ROTATOR CUFF REPAIR  NOV 2011   RIGHT SHOULDER  . TONSILLECTOMY AND ADENOIDECTOMY  1948    Current Medications: Current Meds  Medication Sig  . aspirin EC 81 MG tablet Take 81 mg by mouth daily.  Marland Kitchen atorvastatin (LIPITOR) 80 MG tablet Take 1 tablet (80 mg total) by mouth daily.  . clopidogrel (PLAVIX)  75 MG tablet Take 1 tablet (75 mg total) by mouth daily.  . finasteride (PROSCAR) 5 MG tablet Take 5 mg by mouth daily.  Marland Kitchen lisinopril (PRINIVIL,ZESTRIL) 2.5 MG tablet Take 1 tablet (2.5 mg total) by mouth daily.  . metoprolol tartrate (LOPRESSOR) 25 MG tablet TAKE 0.5 TABLETS (12.5 MG TOTAL) BY MOUTH 2 (TWO) TIMES DAILY.  . nitroGLYCERIN (NITROSTAT) 0.4 MG SL tablet Place 0.4 mg under the tongue as needed for chest pain.  . ranolazine (RANEXA) 500 MG 12 hr tablet Take 1 tablet (500 mg total) by mouth 2 (two) times daily.  . tamsulosin (FLOMAX) 0.4 MG CAPS capsule Take 0.4 mg by mouth daily.     Allergies:   Hydrocodone   Social History   Socioeconomic History  . Marital status: Married    Spouse name: Not on file  . Number of children: Not on file  . Years of education: Not on file  . Highest education level: Not on file  Occupational History  . Not on file  Social Needs  . Financial resource strain: Not on file  . Food insecurity:    Worry: Not on file    Inability: Not on file  . Transportation needs:    Medical: Not on file    Non-medical: Not on file  Tobacco Use  . Smoking status: Never Smoker  . Smokeless tobacco: Never Used  Substance and Sexual Activity  . Alcohol use: No  . Drug use: No  . Sexual activity: Not on file  Lifestyle  . Physical activity:    Days per week: Not on file    Minutes per session: Not on file  . Stress: Not on file  Relationships  . Social connections:    Talks on phone: Not on file    Gets together: Not on file    Attends religious service: Not on file    Active member of club or organization: Not on file    Attends meetings of clubs or organizations: Not on file    Relationship status: Not on file  Other Topics Concern  . Not on file  Social History Narrative   Married, 3 children, truck driver              Family History: The patient's family history includes Cancer in his father and paternal grandmother. ROS:   Please  see the history of present illness.    All 14 point review of systems negative except as described per history of present illness  EKGs/Labs/Other Studies Reviewed:      Recent Labs: No results found for requested labs within last 8760 hours.  Recent Lipid Panel    Component Value Date/Time   CHOL 120 01/13/2017 1012   TRIG 116 01/13/2017 1012   HDL 39 (L) 01/13/2017 1012   CHOLHDL 3.1 01/13/2017 1012   LDLCALC 58 01/13/2017 1012    Physical Exam:    VS:  BP 120/62   Pulse (!) 59   Ht 5\' 11"  (1.803 m)   Wt 288 lb 3.2 oz (130.7 kg)   SpO2 95%   BMI 40.20 kg/m     Wt Readings from Last 3 Encounters:  12/21/17 288 lb 3.2 oz (130.7 kg)  01/13/17 274 lb 1.9 oz (124.3 kg)  10/15/16 286 lb (129.7 kg)     GEN:  Well nourished, well developed in no acute distress HEENT: Normal NECK: No JVD; No carotid bruits LYMPHATICS: No lymphadenopathy CARDIAC: RRR, no murmurs, no rubs, no gallops RESPIRATORY:  Clear to auscultation without rales, wheezing or rhonchi  ABDOMEN: Soft, non-tender, non-distended MUSCULOSKELETAL:  No edema; No deformity  SKIN: Warm and dry LOWER EXTREMITIES: no swelling NEUROLOGIC:  Alert and oriented x 3 PSYCHIATRIC:  Normal affect   ASSESSMENT:    1. Coronary artery disease involving native coronary artery of native heart without angina pectoris   2. Essential hypertension   3. Hypercholesterolemia    PLAN:    In order of problems listed above:  1. Coronary artery disease status post PTCA and stenting 2016 of circumflex artery doing well from that point review asymptomatic and appropriate medications because of multiple risk factors he is on dual antiplatelets therapy. 2. Essential hypertension blood pressure well controlled continue present management. 3. Dyslipidemia he is on Lipitor 80 which I will continue.  Last fasting lipid profile I have is from October which is acceptable and I will continue present management.   Medication  Adjustments/Labs and Tests Ordered: Current medicines are reviewed at length with the patient today.  Concerns regarding medicines are outlined above.  No orders of the defined types were placed in this encounter.  Medication changes: No orders of the defined types were placed in this encounter.   Signed, Park Liter, MD, [facility] 12/21/2017 11:54 AM    [facility]

## 2017-12-21 NOTE — Patient Instructions (Addendum)
Medication Instructions:  Your physician recommends that you continue on your current medications as directed. Please refer to the Current Medication list given to you today.  If you need a refill on your cardiac medications before your next appointment, please call your pharmacy.   Lab work: None ordered If you have labs (blood work) drawn today and your tests are completely normal, you will receive your results only by: Marland Kitchen MyChart Message (if you have MyChart) OR . A paper copy in the mail If you have any lab test that is abnormal or we need to change your treatment, we will call you to review the results.  Testing/Procedures: None ordered  Follow-Up: At [facility], you and your health needs are our priority.  As part of our continuing mission to provide you with exceptional heart care, we have created designated Provider Care Teams.  These Care Teams include your primary Cardiologist (physician) and Advanced Practice Providers (APPs -  Physician Assistants and Nurse Practitioners) who all work together to provide you with the care you need, when you need it. You will need a follow up appointment in 6 Months.  Please call our office 2 months in advance to schedule this appointment.  You may see Jenne Campus or another member of our [employer] Provider Team in [place]: Shirlee More, MD . Jyl Heinz, MD   Any Other Special Instructions Will Be Listed Below (If Applicable).

## 2018-01-04 ENCOUNTER — Telehealth: Payer: Self-pay | Admitting: Cardiology

## 2018-01-04 MED ORDER — CLOPIDOGREL BISULFATE 75 MG PO TABS: 75.0000 mg | ORAL_TABLET | Freq: Every day | ORAL | 1 refills | Status: DC

## 2018-01-04 NOTE — Telephone Encounter (Signed)
Medication refilled

## 2018-01-04 NOTE — Telephone Encounter (Signed)
Call [facility] [address]

## 2018-01-04 NOTE — Addendum Note (Signed)
Addended by: Ashok Norris on: 01/04/2018 02:37 PM   Modules accepted: Orders

## 2018-01-18 ENCOUNTER — Telehealth: Payer: Self-pay | Admitting: Cardiology

## 2018-01-18 MED ORDER — ATORVASTATIN CALCIUM 80 MG PO TABS: 80.0000 mg | ORAL_TABLET | Freq: Every day | ORAL | 0 refills | Status: DC

## 2018-01-18 MED ORDER — METOPROLOL TARTRATE 25 MG PO TABS: 12.5000 mg | ORAL_TABLET | Freq: Two times a day (BID) | ORAL | 0 refills | Status: DC

## 2018-01-18 NOTE — Telephone Encounter (Signed)
Metoprolol tartrate 25 mg 0.5 tablet twice daily refilled and atorvastatin 80 mg daily refilled per Dr. Agustin Cree.

## 2018-01-18 NOTE — Addendum Note (Signed)
Addended by: Ashok Norris on: 01/18/2018 01:56 PM   Modules accepted: Orders

## 2018-01-18 NOTE — Telephone Encounter (Signed)
Call metoprolol and [facility]astatin  to [facility] on [address] [place]

## 2018-01-25 ENCOUNTER — Other Ambulatory Visit: Payer: Self-pay | Admitting: Cardiology

## 2018-03-18 ENCOUNTER — Telehealth: Payer: Self-pay | Admitting: Cardiology

## 2018-03-18 NOTE — Telephone Encounter (Signed)
Patient will be having teeth removed he is on plavix 75 mg daily and aspirin 81 mg daily he needs to know if he needs to hold these medications before having the teeth pull. I will consult with Dr. Agustin Cree.

## 2018-03-18 NOTE — Telephone Encounter (Signed)
Had something to happen last night/he lost 2 teeth and is going to have to have dental work and is on blood thinners and needs advice

## 2018-03-18 NOTE — Telephone Encounter (Signed)
Can stop plavix for 5 days, no asa

## 2018-03-19 NOTE — Telephone Encounter (Signed)
Unable to reach patient at this time will continue efforts.

## 2018-03-24 NOTE — Telephone Encounter (Signed)
Patient's wife informed for patient to hold plavix for 5 days before tooth removal. She will inform patient.

## 2018-04-05 ENCOUNTER — Other Ambulatory Visit: Payer: Self-pay | Admitting: Cardiology

## 2018-04-12 ENCOUNTER — Other Ambulatory Visit: Payer: Self-pay

## 2018-04-12 MED ORDER — ATORVASTATIN CALCIUM 80 MG PO TABS: 80.0000 mg | ORAL_TABLET | Freq: Every day | ORAL | 1 refills | Status: DC

## 2018-04-12 NOTE — Telephone Encounter (Signed)
[facility]atin [facility] [place] per patient preference

## 2018-04-29 ENCOUNTER — Other Ambulatory Visit: Payer: Self-pay

## 2018-04-29 DIAGNOSIS — I2510 Atherosclerotic heart disease of native coronary artery without angina pectoris: Secondary | ICD-10-CM

## 2018-04-29 MED ORDER — RANOLAZINE ER 500 MG PO TB12: 500.0000 mg | ORAL_TABLET | Freq: Two times a day (BID) | ORAL | 0 refills | Status: DC

## 2018-05-10 DIAGNOSIS — C61 Malignant neoplasm of prostate: Secondary | ICD-10-CM | POA: Diagnosis not present

## 2018-05-10 DIAGNOSIS — R351 Nocturia: Secondary | ICD-10-CM | POA: Diagnosis not present

## 2018-05-21 ENCOUNTER — Other Ambulatory Visit: Payer: Self-pay | Admitting: Cardiology

## 2018-05-21 DIAGNOSIS — I2510 Atherosclerotic heart disease of native coronary artery without angina pectoris: Secondary | ICD-10-CM

## 2018-05-27 ENCOUNTER — Other Ambulatory Visit: Payer: Self-pay | Admitting: Cardiology

## 2018-05-27 NOTE — Telephone Encounter (Signed)
Rx refill sent to pharmacy. 

## 2018-06-03 DIAGNOSIS — R05 Cough: Secondary | ICD-10-CM | POA: Diagnosis not present

## 2018-06-03 DIAGNOSIS — J309 Allergic rhinitis, unspecified: Secondary | ICD-10-CM | POA: Diagnosis not present

## 2018-06-03 DIAGNOSIS — Z713 Dietary counseling and surveillance: Secondary | ICD-10-CM | POA: Diagnosis not present

## 2018-06-03 DIAGNOSIS — J018 Other acute sinusitis: Secondary | ICD-10-CM | POA: Diagnosis not present

## 2018-06-11 DIAGNOSIS — H9312 Tinnitus, left ear: Secondary | ICD-10-CM | POA: Diagnosis not present

## 2018-06-11 DIAGNOSIS — H938X1 Other specified disorders of right ear: Secondary | ICD-10-CM | POA: Diagnosis not present

## 2018-06-11 DIAGNOSIS — J018 Other acute sinusitis: Secondary | ICD-10-CM | POA: Diagnosis not present

## 2018-06-11 DIAGNOSIS — H938X2 Other specified disorders of left ear: Secondary | ICD-10-CM | POA: Diagnosis not present

## 2018-06-17 ENCOUNTER — Encounter: Payer: Self-pay | Admitting: Cardiology

## 2018-06-17 ENCOUNTER — Telehealth (INDEPENDENT_AMBULATORY_CARE_PROVIDER_SITE_OTHER): Payer: Medicare HMO | Admitting: Cardiology

## 2018-06-17 ENCOUNTER — Other Ambulatory Visit: Payer: Self-pay

## 2018-06-17 VITALS — Wt 280.0 lb

## 2018-06-17 DIAGNOSIS — I2510 Atherosclerotic heart disease of native coronary artery without angina pectoris: Secondary | ICD-10-CM

## 2018-06-17 DIAGNOSIS — E7800 Pure hypercholesterolemia, unspecified: Secondary | ICD-10-CM

## 2018-06-17 DIAGNOSIS — C61 Malignant neoplasm of prostate: Secondary | ICD-10-CM

## 2018-06-17 DIAGNOSIS — I10 Essential (primary) hypertension: Secondary | ICD-10-CM

## 2018-06-17 NOTE — Patient Instructions (Signed)
Medication Instructions:  .isntcur  If you need a refill on your cardiac medications before your next appointment, please call your pharmacy.   Lab work: Your physician recommends that you return for lab work in a couple weeks: fasting lipids   If you have labs (blood work) drawn today and your tests are completely normal, you will receive your results only by: Marland Kitchen MyChart Message (if you have MyChart) OR . A paper copy in the mail If you have any lab test that is abnormal or we need to change your treatment, we will call you to review the results.  Testing/Procedures: None.   Follow-Up: At [facility], you and your health needs are our priority.  As part of our continuing mission to provide you with exceptional heart care, we have created designated Provider Care Teams.  These Care Teams include your primary Cardiologist (physician) and Advanced Practice Providers (APPs -  Physician Assistants and Nurse Practitioners) who all work together to provide you with the care you need, when you need it. You will need a follow up appointment in 6 months.  Please call our office 2 months in advance to schedule this appointment.  You may see No primary care provider on file. or another member of our [employer] Provider Team in [place]: Anthony More, MD . Anthony Heinz, MD  Any Other Special Instructions Will Be Listed Below (If Applicable).

## 2018-06-17 NOTE — Addendum Note (Signed)
Addended by: Linna Hoff R on: 06/17/2018 10:20 AM   Modules accepted: Orders

## 2018-06-17 NOTE — Progress Notes (Signed)
Virtual Visit via Telephone Note   This visit type was conducted due to national recommendations for restrictions regarding the COVID-19 Pandemic (e.g. social distancing) in an effort to limit this patient's exposure and mitigate transmission in our community.  Due to his co-morbid illnesses, this patient is at least at moderate risk for complications without adequate follow up.  This format is felt to be most appropriate for this patient at this time.  The patient did not have access to video technology/had technical difficulties with video requiring transitioning to audio format only (telephone).  All issues noted in this document were discussed and addressed.  No physical exam could be performed with this format.  Please refer to the patient's chart for his  consent to telehealth for [facility].  Evaluation Performed:  Follow-up visit  This visit type was conducted due to national recommendations for restrictions regarding the COVID-19 Pandemic (e.g. social distancing).  This format is felt to be most appropriate for this patient at this time.  All issues noted in this document were discussed and addressed.  No physical exam was performed (except for noted visual exam findings with Video Visits).  Please refer to the patient's chart (MyChart message for video visits and phone note for telephone visits) for the patient's consent to telehealth for [facility].  Date:  06/17/2018  ID: Anthony Perkins, DOB [birth year], MRN [id number]   Patient Location: [address] [place] [address]   Provider location:   [place] Office  PCP:  Myrlene Broker, MD  Cardiologist:  Jenne Campus, MD     Chief Complaint: Doing well  History of Present Illness:    Anthony Perkins is a 78 y.o. male  who presents via audio/video conferencing for a telehealth visit today.  With coronary artery disease status post PTCA and stent of circumflex artery in 2016.  Dyslipidemia,  essential hypertension overall doing well denies have any chest pain tightness squeezing pressure burning chest when I called him he was driving he pull over we had a conversation.  He was unable to establish video link therefore we only talk on the phone.   The patient does not have symptoms concerning for COVID-19 infection (fever, chills, cough, or new SHORTNESS OF BREATH).    Prior CV studies:   The following studies were reviewed today:       Past Medical History:  Diagnosis Date  . Arthritis KNEES  . Coronary artery disease   . History of cellulitis of skin with lymphangitis 2004     STATES WEARS TED HOSE DAILY--  NO SWELLING BUT SKIN IS RED  SINCE 2004 EPISODE  . History of urinary tract obstruction SECONDARY BPH  . Hyperlipidemia   . Nocturia   . OSA (obstructive sleep apnea)   . Prostate cancer ([place]) 12/20/10   gleason 6, vol 42 cc  . PSA elevation     Past Surgical History:  Procedure Laterality Date  . CARDIAC CATHETERIZATION    . CYSTOSCOPY  07/30/2011   Procedure: CYSTOSCOPY FLEXIBLE;  Surgeon: Joie Bimler, MD;  Location: [facility];  Service: Urology;  Laterality: N/A;  no seeds found in bladder  . MELANOMA EXCISION  1997   LEFT EAR  . PROSTATE BIOPSY  12/20/10  DR Nila Nephew 'S OFFICE   Gleason 6, vol 42 cc  . RADIOACTIVE SEED IMPLANT  07/30/2011   Procedure: RADIOACTIVE SEED IMPLANT;  Surgeon: Joie Bimler, MD;  Location[facility];  Service: Urology;  Laterality: N/A;  64 seeds implanted  . ROTATOR CUFF REPAIR  NOV 2011   RIGHT SHOULDER  . TONSILLECTOMY AND ADENOIDECTOMY  1948     Current Meds  Medication Sig  . aspirin EC 81 MG tablet Take 81 mg by mouth daily.  Marland Kitchen atorvastatin (LIPITOR) 80 MG tablet Take 1 tablet (80 mg total) by mouth daily.  . clopidogrel (PLAVIX) 75 MG tablet Take 1 tablet (75 mg total) by mouth daily.  . finasteride (PROSCAR) 5 MG tablet Take 5 mg by mouth daily.  Marland Kitchen lisinopril (ZESTRIL) 2.5 MG tablet  TAKE 1 TABLET BY MOUTH EVERY DAY  . metoprolol tartrate (LOPRESSOR) 25 MG tablet Take 0.5 tablets (12.5 mg total) by mouth 2 (two) times daily.  . nitroGLYCERIN (NITROSTAT) 0.4 MG SL tablet Place 0.4 mg under the tongue as needed for chest pain.  . ranolazine (RANEXA) 500 MG 12 hr tablet TAKE 1 TABLET BY MOUTH TWICE A DAY  . tamsulosin (FLOMAX) 0.4 MG CAPS capsule Take 0.4 mg by mouth daily.      Family History: The patient's family history includes Cancer in his father and paternal grandmother.   ROS:   Please see the history of present illness.     All other systems reviewed and are negative.   Labs/Other Tests and Data Reviewed:     Recent Labs: No results found for requested labs within last 8760 hours.  Recent Lipid Panel    Component Value Date/Time   CHOL 120 01/13/2017 1012   TRIG 116 01/13/2017 1012   HDL 39 (L) 01/13/2017 1012   CHOLHDL 3.1 01/13/2017 1012   LDLCALC 58 01/13/2017 1012      Exam:    Vital Signs:  Wt 280 lb (127 kg)   BMI 39.05 kg/m     Wt Readings from Last 3 Encounters:  06/17/18 280 lb (127 kg)  12/21/17 288 lb 3.2 oz (130.7 kg)  01/13/17 274 lb 1.9 oz (124.3 kg)     Well nourished, well developed in no acute distress. Alert awake and attentive 3 not in distress doing well  Diagnosis for this visit:   1. Coronary artery disease involving native coronary artery of native heart without angina pectoris   2. Essential hypertension   3. Prostate cancer ([place])   4. Hypercholesterolemia      ASSESSMENT & PLAN:    1.  Coronary artery disease doing well from that point review we will continue present management. 2.  Essential hypertension blood pressure well controlled. 3.  Prostate CA stable. 4.  Dyslipidemia I will schedule him to have fasting lipid profile done.  COVID-19 Education: The signs and symptoms of COVID-19 were discussed with the patient and how to seek care for testing (follow up with PCP or arrange E-visit).  The  importance of social distancing was discussed today.  Patient Risk:   After full review of this patients clinical status, I feel that they are at least moderate risk at this time.  Time:   Today, I have spent 17 inutes with the patient with telehealth technology discussing pt health issues.  I spent 5 minutes reviewing her chart before the visit.  Visit was finished at 9:50 AM.    Medication Adjustments/Labs and Tests Ordered: Current medicines are reviewed at length with the patient today.  Concerns regarding medicines are outlined above.  No orders of the defined types were placed in this encounter.  Medication changes: No orders of the defined types were placed  in this encounter.    Disposition: Follow-up 6 months cholesterol will be done  Signed, Park Liter, MD, [facility] 06/17/2018 9:50 AM    [place]

## 2018-06-24 ENCOUNTER — Other Ambulatory Visit: Payer: Self-pay | Admitting: Cardiology

## 2018-07-06 ENCOUNTER — Other Ambulatory Visit: Payer: Self-pay

## 2018-07-06 MED ORDER — METOPROLOL TARTRATE 25 MG PO TABS: 12.5000 mg | ORAL_TABLET | Freq: Two times a day (BID) | ORAL | 0 refills | Status: DC

## 2018-07-14 DIAGNOSIS — H9191 Unspecified hearing loss, right ear: Secondary | ICD-10-CM

## 2018-07-14 HISTORY — DX: Unspecified hearing loss, right ear: H91.91

## 2018-08-02 DIAGNOSIS — J300 Vasomotor rhinitis: Secondary | ICD-10-CM

## 2018-08-02 DIAGNOSIS — H90A31 Mixed conductive and sensorineural hearing loss, unilateral, right ear with restricted hearing on the contralateral side: Secondary | ICD-10-CM | POA: Diagnosis not present

## 2018-08-02 DIAGNOSIS — H6521 Chronic serous otitis media, right ear: Secondary | ICD-10-CM | POA: Insufficient documentation

## 2018-08-02 DIAGNOSIS — H90A22 Sensorineural hearing loss, unilateral, left ear, with restricted hearing on the contralateral side: Secondary | ICD-10-CM | POA: Diagnosis not present

## 2018-08-02 HISTORY — DX: Chronic serous otitis media, right ear: H65.21

## 2018-08-02 HISTORY — DX: Vasomotor rhinitis: J30.0

## 2018-08-05 ENCOUNTER — Other Ambulatory Visit: Payer: Self-pay | Admitting: Cardiology

## 2018-08-09 DIAGNOSIS — E7800 Pure hypercholesterolemia, unspecified: Secondary | ICD-10-CM | POA: Diagnosis not present

## 2018-08-09 LAB — LIPID PANEL
Chol/HDL Ratio: 2.5 ratio (ref 0.0–5.0)
Cholesterol, Total: 104 mg/dL (ref 100–199)
HDL: 41 mg/dL (ref >39)
LDL Calculated: 46 mg/dL (ref 0–99)
Triglycerides: 84 mg/dL (ref 0–149)
VLDL Cholesterol Cal: 17 mg/dL (ref 5–40)

## 2018-08-22 ENCOUNTER — Other Ambulatory Visit: Payer: Self-pay | Admitting: Cardiology

## 2018-08-23 DIAGNOSIS — S00431A Contusion of right ear, initial encounter: Secondary | ICD-10-CM | POA: Diagnosis not present

## 2018-08-23 DIAGNOSIS — H938X2 Other specified disorders of left ear: Secondary | ICD-10-CM | POA: Diagnosis not present

## 2018-08-23 DIAGNOSIS — H6521 Chronic serous otitis media, right ear: Secondary | ICD-10-CM | POA: Diagnosis not present

## 2018-08-23 DIAGNOSIS — Z7901 Long term (current) use of anticoagulants: Secondary | ICD-10-CM | POA: Diagnosis not present

## 2018-08-28 ENCOUNTER — Other Ambulatory Visit: Payer: Self-pay | Admitting: Cardiology

## 2018-08-28 DIAGNOSIS — I2510 Atherosclerotic heart disease of native coronary artery without angina pectoris: Secondary | ICD-10-CM

## 2018-09-06 DIAGNOSIS — M62830 Muscle spasm of back: Secondary | ICD-10-CM | POA: Diagnosis not present

## 2018-09-06 DIAGNOSIS — M9901 Segmental and somatic dysfunction of cervical region: Secondary | ICD-10-CM | POA: Diagnosis not present

## 2018-09-06 DIAGNOSIS — M9903 Segmental and somatic dysfunction of lumbar region: Secondary | ICD-10-CM | POA: Diagnosis not present

## 2018-09-06 DIAGNOSIS — M542 Cervicalgia: Secondary | ICD-10-CM | POA: Diagnosis not present

## 2018-09-06 DIAGNOSIS — M9902 Segmental and somatic dysfunction of thoracic region: Secondary | ICD-10-CM | POA: Diagnosis not present

## 2018-09-14 DIAGNOSIS — M542 Cervicalgia: Secondary | ICD-10-CM | POA: Diagnosis not present

## 2018-09-14 DIAGNOSIS — M9902 Segmental and somatic dysfunction of thoracic region: Secondary | ICD-10-CM | POA: Diagnosis not present

## 2018-09-14 DIAGNOSIS — M9903 Segmental and somatic dysfunction of lumbar region: Secondary | ICD-10-CM | POA: Diagnosis not present

## 2018-09-14 DIAGNOSIS — M62830 Muscle spasm of back: Secondary | ICD-10-CM | POA: Diagnosis not present

## 2018-09-14 DIAGNOSIS — M9901 Segmental and somatic dysfunction of cervical region: Secondary | ICD-10-CM | POA: Diagnosis not present

## 2018-09-30 ENCOUNTER — Other Ambulatory Visit: Payer: Self-pay

## 2018-10-04 ENCOUNTER — Other Ambulatory Visit: Payer: Self-pay | Admitting: Cardiology

## 2018-10-27 ENCOUNTER — Other Ambulatory Visit: Payer: Self-pay

## 2018-10-27 DIAGNOSIS — I2510 Atherosclerotic heart disease of native coronary artery without angina pectoris: Secondary | ICD-10-CM

## 2018-10-27 MED ORDER — RANOLAZINE ER 500 MG PO TB12: 500.0000 mg | ORAL_TABLET | Freq: Two times a day (BID) | ORAL | 1 refills | Status: DC

## 2018-11-15 DIAGNOSIS — R21 Rash and other nonspecific skin eruption: Secondary | ICD-10-CM | POA: Diagnosis not present

## 2018-11-15 DIAGNOSIS — C61 Malignant neoplasm of prostate: Secondary | ICD-10-CM | POA: Diagnosis not present

## 2018-12-18 DIAGNOSIS — L0390 Cellulitis, unspecified: Secondary | ICD-10-CM | POA: Diagnosis not present

## 2018-12-18 DIAGNOSIS — S81801A Unspecified open wound, right lower leg, initial encounter: Secondary | ICD-10-CM | POA: Diagnosis not present

## 2018-12-20 ENCOUNTER — Other Ambulatory Visit: Payer: Self-pay

## 2018-12-20 ENCOUNTER — Ambulatory Visit (INDEPENDENT_AMBULATORY_CARE_PROVIDER_SITE_OTHER): Payer: Medicare HMO | Admitting: Cardiology

## 2018-12-20 VITALS — BP 126/60 | HR 58 | Ht 71.0 in | Wt 289.8 lb

## 2018-12-20 DIAGNOSIS — G4733 Obstructive sleep apnea (adult) (pediatric): Secondary | ICD-10-CM

## 2018-12-20 DIAGNOSIS — I10 Essential (primary) hypertension: Secondary | ICD-10-CM

## 2018-12-20 DIAGNOSIS — E7800 Pure hypercholesterolemia, unspecified: Secondary | ICD-10-CM | POA: Diagnosis not present

## 2018-12-20 DIAGNOSIS — I2510 Atherosclerotic heart disease of native coronary artery without angina pectoris: Secondary | ICD-10-CM

## 2018-12-20 MED ORDER — RANOLAZINE ER 500 MG PO TB12: 500.0000 mg | ORAL_TABLET | Freq: Two times a day (BID) | ORAL | 0 refills | Status: DC

## 2018-12-20 NOTE — Addendum Note (Signed)
Addended by: Ashok Norris on: 12/20/2018 09:22 AM   Modules accepted: Orders

## 2018-12-20 NOTE — Patient Instructions (Signed)
Medication Instructions:  Your physician recommends that you continue on your current medications as directed. Please refer to the Current Medication list given to you today.  *If you need a refill on your cardiac medications before your next appointment, please call your pharmacy*  Lab Work: None.  If you have labs (blood work) drawn today and your tests are completely normal, you will receive your results only by: . MyChart Message (if you have MyChart) OR . A paper copy in the mail If you have any lab test that is abnormal or we need to change your treatment, we will call you to review the results.  Testing/Procedures: Your physician has requested that you have an echocardiogram. Echocardiography is a painless test that uses sound waves to create images of your heart. It provides your doctor with information about the size and shape of your heart and how well your heart's chambers and valves are working. This procedure takes approximately one hour. There are no restrictions for this procedure.    Follow-Up: At [facility], you and your health needs are our priority.  As part of our continuing mission to provide you with exceptional heart care, we have created designated Provider Care Teams.  These Care Teams include your primary Cardiologist (physician) and Advanced Practice Providers (APPs -  Physician Assistants and Nurse Practitioners) who all work together to provide you with the care you need, when you need it.  Your next appointment:   6 months  The format for your next appointment:   In Person  Provider:   Robert Krasowski, MD  Other Instructions   Echocardiogram An echocardiogram is a procedure that uses painless sound waves (ultrasound) to produce an image of the heart. Images from an echocardiogram can provide important information about:  Signs of coronary artery disease (CAD).  Aneurysm detection. An aneurysm is a weak or damaged part of an artery wall that  bulges out from the normal force of blood pumping through the body.  Heart size and shape. Changes in the size or shape of the heart can be associated with certain conditions, including heart failure, aneurysm, and CAD.  Heart muscle function.  Heart valve function.  Signs of a past heart attack.  Fluid buildup around the heart.  Thickening of the heart muscle.  A tumor or infectious growth around the heart valves. Tell a health care provider about:  Any allergies you have.  All medicines you are taking, including vitamins, herbs, eye drops, creams, and over-the-counter medicines.  Any blood disorders you have.  Any surgeries you have had.  Any medical conditions you have.  Whether you are pregnant or may be pregnant. What are the risks? Generally, this is a safe procedure. However, problems may occur, including:  Allergic reaction to dye (contrast) that may be used during the procedure. What happens before the procedure? No specific preparation is needed. You may eat and drink normally. What happens during the procedure?   An IV tube may be inserted into one of your veins.  You may receive contrast through this tube. A contrast is an injection that improves the quality of the pictures from your heart.  A gel will be applied to your chest.  A wand-like tool (transducer) will be moved over your chest. The gel will help to transmit the sound waves from the transducer.  The sound waves will harmlessly bounce off of your heart to allow the heart images to be captured in real-time motion. The images will be recorded   on a computer. The procedure may vary among health care providers and hospitals. What happens after the procedure?  You may return to your normal, everyday life, including diet, activities, and medicines, unless your health care provider tells you not to do that. Summary  An echocardiogram is a procedure that uses painless sound waves (ultrasound) to produce  an image of the heart.  Images from an echocardiogram can provide important information about the size and shape of your heart, heart muscle function, heart valve function, and fluid buildup around your heart.  You do not need to do anything to prepare before this procedure. You may eat and drink normally.  After the echocardiogram is completed, you may return to your normal, everyday life, unless your health care provider tells you not to do that. This information is not intended to replace advice given to you by your health care provider. Make sure you discuss any questions you have with your health care provider. Document Released: 12/21/1999 Document Revised: 04/15/2018 Document Reviewed: 01/26/2016 Elsevier Patient Education  2020 [employer]   

## 2018-12-20 NOTE — Progress Notes (Signed)
Cardiology Office Note:    Date:  12/20/2018   ID:  Anthony Perkins, DOB 1940/06/09, MRN [id number]  PCP:  Myrlene Broker, MD  Cardiologist:  Jenne Campus, MD    Referring MD: Myrlene Broker, MD   No chief complaint on file. Doing well  History of Present Illness:    Anthony Perkins is a 78 y.o. male with past medical history significant for coronary artery disease, status post PTCA and drug-eluting stent to circumflex artery in 2016.  Also hypertension sleep apnea dyslipidemia.  Comes today to my office for follow-up.  He still drives truck.  Denies have any chest pain, tightness, pressure, burning, squeezing in the chest.  Does describe to have some fatigue and shortness of breath but overall seems to be doing well.  Does have cellulitis on her right lower extremities that being managed with antibiotic given by primary care physician.  Past Medical History:  Diagnosis Date  . Arthritis KNEES  . Coronary artery disease   . History of cellulitis of skin with lymphangitis 2004     STATES WEARS TED HOSE DAILY--  NO SWELLING BUT SKIN IS RED  SINCE 2004 EPISODE  . History of urinary tract obstruction SECONDARY BPH  . Hyperlipidemia   . Nocturia   . OSA (obstructive sleep apnea)   . Prostate cancer ([place]) 12/20/10   gleason 6, vol 42 cc  . PSA elevation     Past Surgical History:  Procedure Laterality Date  . CARDIAC CATHETERIZATION    . CYSTOSCOPY  07/30/2011   Procedure: CYSTOSCOPY FLEXIBLE;  Surgeon: Joie Bimler, MD;  Location: [facility];  Service: Urology;  Laterality: N/A;  no seeds found in bladder  . MELANOMA EXCISION  1997   LEFT EAR  . PROSTATE BIOPSY  12/20/10  DR Nila Nephew 'S OFFICE   Gleason 6, vol 42 cc  . RADIOACTIVE SEED IMPLANT  07/30/2011   Procedure: RADIOACTIVE SEED IMPLANT;  Surgeon: Joie Bimler, MD;  Location[facility];  Service: Urology;  Laterality: N/A;  64 seeds implanted  . ROTATOR CUFF REPAIR  NOV 2011   RIGHT  SHOULDER  . TONSILLECTOMY AND ADENOIDECTOMY  1948    Current Medications: Current Meds  Medication Sig  . aspirin EC 81 MG tablet Take 81 mg by mouth daily.  Marland Kitchen atorvastatin (LIPITOR) 80 MG tablet TAKE 1 TABLET BY MOUTH EVERY DAY  . clopidogrel (PLAVIX) 75 MG tablet TAKE 1 TABLET BY MOUTH EVERY DAY  . finasteride (PROSCAR) 5 MG tablet Take 5 mg by mouth daily.  Marland Kitchen lisinopril (ZESTRIL) 2.5 MG tablet TAKE 1 TABLET BY MOUTH EVERY DAY  . metoprolol tartrate (LOPRESSOR) 25 MG tablet TAKE 0.5 TABLETS (12.5 MG TOTAL) BY MOUTH 2 (TWO) TIMES DAILY.  . nitroGLYCERIN (NITROSTAT) 0.4 MG SL tablet Place 0.4 mg under the tongue as needed for chest pain.  . ranolazine (RANEXA) 500 MG 12 hr tablet Take 1 tablet (500 mg total) by mouth 2 (two) times daily.  . tamsulosin (FLOMAX) 0.4 MG CAPS capsule Take 0.4 mg by mouth daily.     Allergies:   Hydrocodone   Social History   Socioeconomic History  . Marital status: Married    Spouse name: Not on file  . Number of children: Not on file  . Years of education: Not on file  . Highest education level: Not on file  Occupational History  . Not on file  Tobacco Use  . Smoking status: Never Smoker  .  Smokeless tobacco: Never Used  Substance and Sexual Activity  . Alcohol use: No  . Drug use: No  . Sexual activity: Not on file  Other Topics Concern  . Not on file  Social History Narrative   Married, 3 children, truck Geophysicist/field seismologist            Social Determinants of Health   Financial Resource Strain:   . Difficulty of Paying Living Expenses: Not on file  Food Insecurity:   . Worried About Charity fundraiser in the Last Year: Not on file  . Ran Out of Food in the Last Year: Not on file  Transportation Needs:   . Lack of Transportation (Medical): Not on file  . Lack of Transportation (Non-Medical): Not on file  Physical Activity:   . Days of Exercise per Week: Not on file  . Minutes of Exercise per Session: Not on file  Stress:   . Feeling of  Stress : Not on file  Social Connections:   . Frequency of Communication with Friends and Family: Not on file  . Frequency of Social Gatherings with Friends and Family: Not on file  . Attends Religious Services: Not on file  . Active Member of Clubs or Organizations: Not on file  . Attends Archivist Meetings: Not on file  . Marital Status: Not on file     Family History: The patient's family history includes Cancer in his father and paternal grandmother. ROS:   Please see the history of present illness.    All 14 point review of systems negative except as described per history of present illness  EKGs/Labs/Other Studies Reviewed:      Recent Labs: No results found for requested labs within last 8760 hours.  Recent Lipid Panel    Component Value Date/Time   CHOL 104 08/09/2018 0953   TRIG 84 08/09/2018 0953   HDL 41 08/09/2018 0953   CHOLHDL 2.5 08/09/2018 0953   LDLCALC 46 08/09/2018 0953    Physical Exam:    VS:  BP 126/60   Pulse (!) 58   Ht 5\' 11"  (1.803 m)   Wt 289 lb 12.8 oz (131.5 kg)   SpO2 98%   BMI 40.42 kg/m     Wt Readings from Last 3 Encounters:  12/20/18 289 lb 12.8 oz (131.5 kg)  06/17/18 280 lb (127 kg)  12/21/17 288 lb 3.2 oz (130.7 kg)     GEN:  Well nourished, well developed in no acute distress HEENT: Normal NECK: No JVD; No carotid bruits LYMPHATICS: No lymphadenopathy CARDIAC: RRR, no murmurs, no rubs, no gallops RESPIRATORY:  Clear to auscultation without rales, wheezing or rhonchi  ABDOMEN: Soft, non-tender, non-distended MUSCULOSKELETAL:  No edema; No deformity  SKIN: Warm and dry LOWER EXTREMITIES: no swelling NEUROLOGIC:  Alert and oriented x 3 PSYCHIATRIC:  Normal affect   ASSESSMENT:    1. Essential hypertension   2. Coronary artery disease involving native coronary artery of native heart without angina pectoris   3. Obstructive sleep apnea syndrome   4. Hypercholesterolemia    PLAN:    In order of problems  listed above:  1. Essential hypertension blood pressure well controlled continue present management. 2. Coronary disease stable from that point review doing well continue present management. 3. Obstructive sleep apnea use CPAP mask on the regular basis 4. Dyslipidemia last fasting lipid profile excellent.  On Lipitor 80 which is high intensity statin which I will continue. 5. Overall seems to be doing well  I will ask her to have an echocardiogram to recheck left ventricle ejection fraction.  I see him back 6 months   Medication Adjustments/Labs and Tests Ordered: Current medicines are reviewed at length with the patient today.  Concerns regarding medicines are outlined above.  No orders of the defined types were placed in this encounter.  Medication changes: No orders of the defined types were placed in this encounter.   Signed, Park Liter, MD, [facility] 12/20/2018 9:16 AM    [place]

## 2018-12-21 ENCOUNTER — Other Ambulatory Visit: Payer: Self-pay | Admitting: Cardiology

## 2019-01-04 DIAGNOSIS — I87319 Chronic venous hypertension (idiopathic) with ulcer of unspecified lower extremity: Secondary | ICD-10-CM | POA: Diagnosis not present

## 2019-01-04 DIAGNOSIS — I890 Lymphedema, not elsewhere classified: Secondary | ICD-10-CM | POA: Diagnosis not present

## 2019-01-10 DIAGNOSIS — I87319 Chronic venous hypertension (idiopathic) with ulcer of unspecified lower extremity: Secondary | ICD-10-CM | POA: Diagnosis not present

## 2019-01-10 DIAGNOSIS — I890 Lymphedema, not elsewhere classified: Secondary | ICD-10-CM | POA: Diagnosis not present

## 2019-01-10 DIAGNOSIS — I8312 Varicose veins of left lower extremity with inflammation: Secondary | ICD-10-CM | POA: Diagnosis not present

## 2019-01-10 DIAGNOSIS — I8311 Varicose veins of right lower extremity with inflammation: Secondary | ICD-10-CM | POA: Diagnosis not present

## 2019-01-17 DIAGNOSIS — I890 Lymphedema, not elsewhere classified: Secondary | ICD-10-CM | POA: Diagnosis not present

## 2019-01-17 DIAGNOSIS — I8312 Varicose veins of left lower extremity with inflammation: Secondary | ICD-10-CM | POA: Diagnosis not present

## 2019-01-17 DIAGNOSIS — I8311 Varicose veins of right lower extremity with inflammation: Secondary | ICD-10-CM | POA: Diagnosis not present

## 2019-02-07 ENCOUNTER — Other Ambulatory Visit: Payer: Medicare HMO

## 2019-02-07 DIAGNOSIS — Z85828 Personal history of other malignant neoplasm of skin: Secondary | ICD-10-CM | POA: Diagnosis not present

## 2019-02-07 DIAGNOSIS — L814 Other melanin hyperpigmentation: Secondary | ICD-10-CM | POA: Diagnosis not present

## 2019-02-07 DIAGNOSIS — L570 Actinic keratosis: Secondary | ICD-10-CM | POA: Diagnosis not present

## 2019-02-07 DIAGNOSIS — L219 Seborrheic dermatitis, unspecified: Secondary | ICD-10-CM | POA: Diagnosis not present

## 2019-02-19 ENCOUNTER — Other Ambulatory Visit: Payer: Self-pay | Admitting: Cardiology

## 2019-03-11 ENCOUNTER — Other Ambulatory Visit: Payer: Self-pay | Admitting: Cardiology

## 2019-03-18 ENCOUNTER — Other Ambulatory Visit: Payer: Self-pay | Admitting: Cardiology

## 2019-03-21 ENCOUNTER — Encounter (INDEPENDENT_AMBULATORY_CARE_PROVIDER_SITE_OTHER): Payer: Self-pay

## 2019-03-21 ENCOUNTER — Other Ambulatory Visit: Payer: Self-pay

## 2019-03-21 ENCOUNTER — Ambulatory Visit (INDEPENDENT_AMBULATORY_CARE_PROVIDER_SITE_OTHER): Payer: Medicare HMO

## 2019-03-21 DIAGNOSIS — I890 Lymphedema, not elsewhere classified: Secondary | ICD-10-CM | POA: Diagnosis not present

## 2019-03-21 DIAGNOSIS — I2510 Atherosclerotic heart disease of native coronary artery without angina pectoris: Secondary | ICD-10-CM

## 2019-03-21 NOTE — Progress Notes (Unsigned)
Complete echocardiogram has been performed.  Jimmy Khadim Lundberg RDCS, RVT 

## 2019-04-04 DIAGNOSIS — I2510 Atherosclerotic heart disease of native coronary artery without angina pectoris: Secondary | ICD-10-CM | POA: Diagnosis not present

## 2019-04-04 DIAGNOSIS — N400 Enlarged prostate without lower urinary tract symptoms: Secondary | ICD-10-CM | POA: Diagnosis not present

## 2019-04-04 DIAGNOSIS — E785 Hyperlipidemia, unspecified: Secondary | ICD-10-CM | POA: Diagnosis not present

## 2019-04-04 DIAGNOSIS — R32 Unspecified urinary incontinence: Secondary | ICD-10-CM | POA: Diagnosis not present

## 2019-04-04 DIAGNOSIS — N529 Male erectile dysfunction, unspecified: Secondary | ICD-10-CM | POA: Diagnosis not present

## 2019-04-04 DIAGNOSIS — I10 Essential (primary) hypertension: Secondary | ICD-10-CM | POA: Diagnosis not present

## 2019-04-04 DIAGNOSIS — G8929 Other chronic pain: Secondary | ICD-10-CM | POA: Diagnosis not present

## 2019-04-04 DIAGNOSIS — I252 Old myocardial infarction: Secondary | ICD-10-CM | POA: Diagnosis not present

## 2019-04-04 DIAGNOSIS — J309 Allergic rhinitis, unspecified: Secondary | ICD-10-CM | POA: Diagnosis not present

## 2019-04-04 DIAGNOSIS — E669 Obesity, unspecified: Secondary | ICD-10-CM | POA: Diagnosis not present

## 2019-04-07 DIAGNOSIS — I890 Lymphedema, not elsewhere classified: Secondary | ICD-10-CM | POA: Diagnosis not present

## 2019-05-07 DIAGNOSIS — I890 Lymphedema, not elsewhere classified: Secondary | ICD-10-CM | POA: Diagnosis not present

## 2019-06-07 DIAGNOSIS — I890 Lymphedema, not elsewhere classified: Secondary | ICD-10-CM | POA: Diagnosis not present

## 2019-06-13 DIAGNOSIS — L219 Seborrheic dermatitis, unspecified: Secondary | ICD-10-CM | POA: Diagnosis not present

## 2019-06-13 DIAGNOSIS — L570 Actinic keratosis: Secondary | ICD-10-CM | POA: Diagnosis not present

## 2019-06-13 DIAGNOSIS — C44222 Squamous cell carcinoma of skin of right ear and external auricular canal: Secondary | ICD-10-CM | POA: Diagnosis not present

## 2019-06-13 DIAGNOSIS — Z85828 Personal history of other malignant neoplasm of skin: Secondary | ICD-10-CM | POA: Diagnosis not present

## 2019-06-13 DIAGNOSIS — C44629 Squamous cell carcinoma of skin of left upper limb, including shoulder: Secondary | ICD-10-CM | POA: Diagnosis not present

## 2019-06-13 DIAGNOSIS — D2362 Other benign neoplasm of skin of left upper limb, including shoulder: Secondary | ICD-10-CM | POA: Diagnosis not present

## 2019-06-20 ENCOUNTER — Other Ambulatory Visit: Payer: Self-pay | Admitting: Cardiology

## 2019-06-20 DIAGNOSIS — I2510 Atherosclerotic heart disease of native coronary artery without angina pectoris: Secondary | ICD-10-CM

## 2019-06-27 DIAGNOSIS — C61 Malignant neoplasm of prostate: Secondary | ICD-10-CM | POA: Diagnosis not present

## 2019-06-27 DIAGNOSIS — R21 Rash and other nonspecific skin eruption: Secondary | ICD-10-CM | POA: Diagnosis not present

## 2019-07-07 DIAGNOSIS — I890 Lymphedema, not elsewhere classified: Secondary | ICD-10-CM | POA: Diagnosis not present

## 2019-07-21 ENCOUNTER — Other Ambulatory Visit: Payer: Self-pay | Admitting: Cardiology

## 2019-08-07 DIAGNOSIS — I890 Lymphedema, not elsewhere classified: Secondary | ICD-10-CM | POA: Diagnosis not present

## 2019-08-08 ENCOUNTER — Ambulatory Visit: Payer: Medicare HMO | Admitting: Cardiology

## 2019-08-14 ENCOUNTER — Other Ambulatory Visit: Payer: Self-pay | Admitting: Cardiology

## 2019-09-05 ENCOUNTER — Other Ambulatory Visit: Payer: Self-pay

## 2019-09-05 ENCOUNTER — Encounter: Payer: Self-pay | Admitting: Cardiology

## 2019-09-05 ENCOUNTER — Ambulatory Visit: Payer: Medicare HMO | Admitting: Cardiology

## 2019-09-05 VITALS — BP 122/70 | HR 49 | Ht 71.0 in | Wt 295.0 lb

## 2019-09-05 DIAGNOSIS — E7800 Pure hypercholesterolemia, unspecified: Secondary | ICD-10-CM | POA: Diagnosis not present

## 2019-09-05 DIAGNOSIS — I2510 Atherosclerotic heart disease of native coronary artery without angina pectoris: Secondary | ICD-10-CM

## 2019-09-05 DIAGNOSIS — I10 Essential (primary) hypertension: Secondary | ICD-10-CM

## 2019-09-05 DIAGNOSIS — G4733 Obstructive sleep apnea (adult) (pediatric): Secondary | ICD-10-CM | POA: Diagnosis not present

## 2019-09-05 DIAGNOSIS — C61 Malignant neoplasm of prostate: Secondary | ICD-10-CM

## 2019-09-05 MED ORDER — NITROGLYCERIN 0.4 MG SL SUBL: 0.4000 mg | SUBLINGUAL_TABLET | SUBLINGUAL | 6 refills | Status: AC | PRN

## 2019-09-05 NOTE — Patient Instructions (Signed)
Medication Instructions:  Your physician has recommended you make the following change in your medication:  STOP: Metoprolol   *If you need a refill on your cardiac medications before your next appointment, please call your pharmacy*   Lab Work: Your physician recommends that you return for lab work today: lipid  If you have labs (blood work) drawn today and your tests are completely normal, you will receive your results only by: Marland Kitchen MyChart Message (if you have MyChart) OR . A paper copy in the mail If you have any lab test that is abnormal or we need to change your treatment, we will call you to review the results.   Testing/Procedures: None.    Follow-Up: At [facility], you and your health needs are our priority.  As part of our continuing mission to provide you with exceptional heart care, we have created designated Provider Care Teams.  These Care Teams include your primary Cardiologist (physician) and Advanced Practice Providers (APPs -  Physician Assistants and Nurse Practitioners) who all work together to provide you with the care you need, when you need it.  We recommend signing up for the patient portal called "MyChart".  Sign up information is provided on this After Visit Summary.  MyChart is used to connect with patients for Virtual Visits (Telemedicine).  Patients are able to view lab/test results, encounter notes, upcoming appointments, etc.  Non-urgent messages can be sent to your provider as well.   To learn more about what you can do with MyChart, go to [email or web address].    Your next appointment:   6 month(s)  The format for your next appointment:   In Person  Provider:   Jenne Campus, MD   Other Instructions

## 2019-09-05 NOTE — Addendum Note (Signed)
Addended by: Ashok Norris on: 09/05/2019 10:32 AM   Modules accepted: Orders

## 2019-09-05 NOTE — Progress Notes (Signed)
Cardiology Office Note:    Date:  09/05/2019   ID:  Anthony Perkins, DOB 02-20-1940, MRN [id number]  PCP:  Myrlene Broker, MD  Cardiologist:  Jenne Campus, MD    Referring MD: Myrlene Broker, MD   Chief Complaint  Patient presents with  . Follow-up  Doing well  History of Present Illness:    Anthony Perkins is a 79 y.o. male with past medical history significant for coronary artery disease, status post PTCA and stenting with drug-eluting stent to circumflex artery in 2016.  He also have history of essential hypertension, obstructive sleep apnea, refused to use CPAP mask, dyslipidemia.  Today to my office for follow-up.  Overall he is doing well.  Denies have any chest pain tightness squeezing pressure burning chest.  Few weeks ago he tripped and fell down the end of having some bruises on his legs but otherwise seems to be doing well.   Past Medical History:  Diagnosis Date  . Arthritis KNEES  . Coronary artery disease   . History of cellulitis of skin with lymphangitis 2004     STATES WEARS TED HOSE DAILY--  NO SWELLING BUT SKIN IS RED  SINCE 2004 EPISODE  . History of urinary tract obstruction SECONDARY BPH  . Hyperlipidemia   . Nocturia   . OSA (obstructive sleep apnea)   . Prostate cancer ([place]) 12/20/10   gleason 6, vol 42 cc  . PSA elevation     Past Surgical History:  Procedure Laterality Date  . CARDIAC CATHETERIZATION    . CYSTOSCOPY  07/30/2011   Procedure: CYSTOSCOPY FLEXIBLE;  Surgeon: Joie Bimler, MD;  Location[facility];  Service: Urology;  Laterality: N/A;  no seeds found in bladder  . MELANOMA EXCISION  1997   LEFT EAR  . PROSTATE BIOPSY  12/20/10  DR Nila Nephew 'S OFFICE   Gleason 6, vol 42 cc  . RADIOACTIVE SEED IMPLANT  07/30/2011   Procedure: RADIOACTIVE SEED IMPLANT;  Surgeon: Joie Bimler, MD;  Location[facility];  Service: Urology;  Laterality: N/A;  64 seeds implanted  . ROTATOR CUFF REPAIR  NOV 2011   RIGHT  SHOULDER  . TONSILLECTOMY AND ADENOIDECTOMY  1948    Current Medications: Current Meds  Medication Sig  . aspirin EC 81 MG tablet Take 81 mg by mouth daily.  Marland Kitchen atorvastatin (LIPITOR) 80 MG tablet TAKE 1 TABLET BY MOUTH EVERY DAY  . clopidogrel (PLAVIX) 75 MG tablet TAKE 1 TABLET BY MOUTH EVERY DAY  . clotrimazole-betamethasone (LOTRISONE) cream Apply 1 application topically daily.  . finasteride (PROSCAR) 5 MG tablet Take 5 mg by mouth daily.  Marland Kitchen lisinopril (ZESTRIL) 2.5 MG tablet TAKE 1 TABLET BY MOUTH EVERY DAY  . metoprolol tartrate (LOPRESSOR) 25 MG tablet TAKE 0.5 TABLETS (12.5 MG TOTAL) BY MOUTH 2 (TWO) TIMES DAILY.  . nitroGLYCERIN (NITROSTAT) 0.4 MG SL tablet Place 0.4 mg under the tongue as needed for chest pain.  . ranolazine (RANEXA) 500 MG 12 hr tablet TAKE 1 TABLET BY MOUTH TWICE A DAY  . tamsulosin (FLOMAX) 0.4 MG CAPS capsule Take 0.4 mg by mouth daily.     Allergies:   Hydrocodone   Social History   Socioeconomic History  . Marital status: Married    Spouse name: Not on file  . Number of children: Not on file  . Years of education: Not on file  . Highest education level: Not on file  Occupational History  . Not on file  Tobacco Use  . Smoking status: Never Smoker  . Smokeless tobacco: Never Used  Vaping Use  . Vaping Use: Never used  Substance and Sexual Activity  . Alcohol use: No  . Drug use: No  . Sexual activity: Not on file  Other Topics Concern  . Not on file  Social History Narrative   Married, 3 children, truck Geophysicist/field seismologist            Social Determinants of Health   Financial Resource Strain:   . Difficulty of Paying Living Expenses: Not on file  Food Insecurity:   . Worried About Charity fundraiser in the Last Year: Not on file  . Ran Out of Food in the Last Year: Not on file  Transportation Needs:   . Lack of Transportation (Medical): Not on file  . Lack of Transportation (Non-Medical): Not on file  Physical Activity:   . Days of Exercise  per Week: Not on file  . Minutes of Exercise per Session: Not on file  Stress:   . Feeling of Stress : Not on file  Social Connections:   . Frequency of Communication with Friends and Family: Not on file  . Frequency of Social Gatherings with Friends and Family: Not on file  . Attends Religious Services: Not on file  . Active Member of Clubs or Organizations: Not on file  . Attends Archivist Meetings: Not on file  . Marital Status: Not on file     Family History: The patient's family history includes Cancer in his father and paternal grandmother. ROS:   Please see the history of present illness.    All 14 point review of systems negative except as described per history of present illness  EKGs/Labs/Other Studies Reviewed:      Recent Labs: No results found for requested labs within last 8760 hours.  Recent Lipid Panel    Component Value Date/Time   CHOL 104 08/09/2018 0953   TRIG 84 08/09/2018 0953   HDL 41 08/09/2018 0953   CHOLHDL 2.5 08/09/2018 0953   LDLCALC 46 08/09/2018 0953    Physical Exam:    VS:  BP 122/70 (BP Location: Right Arm, Patient Position: Sitting, Cuff Size: Large)   Pulse (!) 49   Ht 5\' 11"  (1.803 m)   Wt 295 lb (133.8 kg)   SpO2 98%   BMI 41.14 kg/m     Wt Readings from Last 3 Encounters:  09/05/19 295 lb (133.8 kg)  12/20/18 289 lb 12.8 oz (131.5 kg)  06/17/18 280 lb (127 kg)     GEN:  Well nourished, well developed in no acute distress HEENT: Normal NECK: No JVD; No carotid bruits LYMPHATICS: No lymphadenopathy CARDIAC: RRR, no murmurs, no rubs, no gallops RESPIRATORY:  Clear to auscultation without rales, wheezing or rhonchi  ABDOMEN: Soft, non-tender, non-distended MUSCULOSKELETAL:  No edema; No deformity  SKIN: Warm and dry LOWER EXTREMITIES: no swelling NEUROLOGIC:  Alert and oriented x 3 PSYCHIATRIC:  Normal affect   ASSESSMENT:    1. Coronary artery disease involving native coronary artery of native heart  without angina pectoris   2. Essential hypertension   3. Hypercholesterolemia   4. Prostate cancer ([place])   5. Obstructive sleep apnea syndrome    PLAN:    In order of problems listed above:  1. Coronary disease doing well from that point review asymptomatic.  However does have multiple risk factors for coronary artery disease.  He is on dual antiplatelets therapy and the  therapy has been extended beyond 1 year.  I consider him high risk and that is why he is on dual antiplatelets therapy. 2. Essential hypertension blood pressure well controlled today 122/70 we will continue present management. 3. Dyslipidemia: I did review his K PN which showed me he is HDL of 41 LDL 46 this is from 08/09/2018 which is year ago.  We will do the test. 4. Prostate CA: I did review note by urologist.  Continue present management. 5. Obstructive sleep apnea: He does not use mask and he does not want to use months.  I stressed importance of treatment of sleep apnea however he does not want to do that. 6.  Because of bradycardia which is sinus with right bundle branch block left anterior fascicular block I will discontinue his beta-blocker.  He does have preserved left ventricle ejection fraction does not have congestive heart failure his event was more than 3 years ago.  Therefore, we will be able to discontinue his beta-blocker.  Luckily he is asymptomatic. Medication Adjustments/Labs and Tests Ordered: Current medicines are reviewed at length with the patient today.  Concerns regarding medicines are outlined above.  No orders of the defined types were placed in this encounter.  Medication changes: No orders of the defined types were placed in this encounter.   Signed, Park Liter, MD, [facility] 09/05/2019 10:26 AM    [place]

## 2019-09-06 LAB — LIPID PANEL
Chol/HDL Ratio: 3.1 ratio (ref 0.0–5.0)
Cholesterol, Total: 113 mg/dL (ref 100–199)
HDL: 36 mg/dL — ABNORMAL LOW (ref >39)
LDL Chol Calc (NIH): 48 mg/dL (ref 0–99)
Triglycerides: 171 mg/dL — ABNORMAL HIGH (ref 0–149)
VLDL Cholesterol Cal: 29 mg/dL (ref 5–40)

## 2019-09-07 DIAGNOSIS — I890 Lymphedema, not elsewhere classified: Secondary | ICD-10-CM | POA: Diagnosis not present

## 2019-09-11 ENCOUNTER — Other Ambulatory Visit: Payer: Self-pay | Admitting: Cardiology

## 2019-09-20 ENCOUNTER — Encounter: Payer: Self-pay | Admitting: Emergency Medicine

## 2019-09-26 ENCOUNTER — Telehealth: Payer: Self-pay | Admitting: Cardiology

## 2019-09-26 NOTE — Telephone Encounter (Signed)
Left message for the pt to call back.

## 2019-09-26 NOTE — Telephone Encounter (Signed)
Patient is calling for his results

## 2019-09-26 NOTE — Telephone Encounter (Signed)
Spoke with the pt and gave him his Lipid panel results and he verbalized understanding.

## 2019-10-07 DIAGNOSIS — I890 Lymphedema, not elsewhere classified: Secondary | ICD-10-CM | POA: Diagnosis not present

## 2019-10-12 ENCOUNTER — Other Ambulatory Visit: Payer: Self-pay | Admitting: Cardiology

## 2019-10-12 NOTE — Telephone Encounter (Signed)
Refill sent to pharmacy.   

## 2019-11-07 DIAGNOSIS — I890 Lymphedema, not elsewhere classified: Secondary | ICD-10-CM | POA: Diagnosis not present

## 2019-11-08 ENCOUNTER — Telehealth: Payer: Self-pay | Admitting: Cardiology

## 2019-11-08 DIAGNOSIS — R69 Illness, unspecified: Secondary | ICD-10-CM | POA: Diagnosis not present

## 2019-11-08 NOTE — Telephone Encounter (Signed)
   [facility] Pre-operative Risk Assessment    HEARTCARE STAFF: - Please ensure there is not already an duplicate clearance open for this procedure. - Under Visit Info/Reason for Call, type in Other and utilize the format Clearance MM/DD/YY or Clearance TBD. Do not use dashes or single digits. - If request is for dental extraction, please clarify the # of teeth to be extracted.  Request for surgical clearance:  1. What type of surgery is being performed?  Multiple Extractions  2. When is this surgery scheduled? TBD  3. What type of clearance is required (medical clearance vs. Pharmacy clearance to hold med vs. Both)? Pharmacy  4. Are there any medications that need to be held prior to surgery and how long? Coumadin; TBD by doctor    5. Practice name and name of physician performing surgery? [facility]; Dr. Woodfin Ganja   6. What is the office phone number? [phone]   7.   What is the office fax number? [phone]  8.   Anesthesia type (None, local, MAC, general) ? None   Durel Salts 11/08/2019, 4:26 PM  _________________________________________________________________   (provider comments below)

## 2019-11-08 NOTE — Telephone Encounter (Signed)
Please confirm the number of extractions

## 2019-11-09 NOTE — Telephone Encounter (Signed)
Called the requesting office of [facility] and spoke with [place]. She said the patient has a total of 12 teeth/root tips to be extracted. Plan is to extract 4-5 over multiple visits. She asked if we could consider this an urgent matter due to infection.

## 2019-11-10 NOTE — Telephone Encounter (Addendum)
Called the requesting office [facility] and when I inquired information involving ASA and Plavix and I was told to just cancel the clearance request. Patient ended up going to the ER for the infection.

## 2019-11-10 NOTE — Telephone Encounter (Signed)
Patient is not on coumadin. Looks like he is on plavix and ASA. Please confirm dentist wants pt to hold ASA and Plavix. If so, this request will need to go to Dr. Agustin Cree.

## 2019-11-18 ENCOUNTER — Other Ambulatory Visit: Payer: Self-pay | Admitting: Cardiology

## 2019-12-07 DIAGNOSIS — I890 Lymphedema, not elsewhere classified: Secondary | ICD-10-CM | POA: Diagnosis not present

## 2019-12-09 ENCOUNTER — Other Ambulatory Visit: Payer: Self-pay | Admitting: Cardiology

## 2020-01-07 DIAGNOSIS — I890 Lymphedema, not elsewhere classified: Secondary | ICD-10-CM | POA: Diagnosis not present

## 2020-01-09 DIAGNOSIS — L821 Other seborrheic keratosis: Secondary | ICD-10-CM | POA: Diagnosis not present

## 2020-01-09 DIAGNOSIS — Z85828 Personal history of other malignant neoplasm of skin: Secondary | ICD-10-CM | POA: Diagnosis not present

## 2020-01-09 DIAGNOSIS — Z85820 Personal history of malignant melanoma of skin: Secondary | ICD-10-CM | POA: Diagnosis not present

## 2020-01-09 DIAGNOSIS — L814 Other melanin hyperpigmentation: Secondary | ICD-10-CM | POA: Diagnosis not present

## 2020-01-09 DIAGNOSIS — L570 Actinic keratosis: Secondary | ICD-10-CM | POA: Diagnosis not present

## 2020-01-16 DIAGNOSIS — C61 Malignant neoplasm of prostate: Secondary | ICD-10-CM | POA: Diagnosis not present

## 2020-01-20 ENCOUNTER — Telehealth: Payer: Self-pay | Admitting: *Deleted

## 2020-01-20 ENCOUNTER — Other Ambulatory Visit: Payer: Self-pay | Admitting: Cardiology

## 2020-01-20 DIAGNOSIS — I2510 Atherosclerotic heart disease of native coronary artery without angina pectoris: Secondary | ICD-10-CM

## 2020-01-20 NOTE — Telephone Encounter (Signed)
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 Dr. Agustin Cree stated we could go ahead and order that and get it scheduled. This was done and letter given to pt with instructions.

## 2020-01-24 ENCOUNTER — Telehealth: Payer: Self-pay | Admitting: Cardiology

## 2020-01-24 NOTE — Telephone Encounter (Signed)
    Rudene Re calling, he said he would like to verify Dr. Wendy Poet notes on  09/05/2019 and 12/20/2019 that pt have sleep apnea and need to be on cpap machine. He said the pt is denying sleep apnea and don't want to be on cpap

## 2020-01-25 ENCOUNTER — Telehealth (HOSPITAL_COMMUNITY): Payer: Self-pay | Admitting: *Deleted

## 2020-01-25 ENCOUNTER — Telehealth: Payer: Self-pay | Admitting: Cardiology

## 2020-01-25 NOTE — Telephone Encounter (Signed)
Left message on voicemail per DPR in reference to upcoming appointment scheduled on 01/31/20 at 8:15 with detailed instructions given per Myocardial Perfusion Study Information Sheet for the test. LM to arrive 15 minutes early, and that it is imperative to arrive on time for appointment to keep from having the test rescheduled. If you need to cancel or reschedule your appointment, please call the office within 24 hours of your appointment. Failure to do so may result in a cancellation of your appointment, and a $50 no show fee. Phone number given for call back for any questions.    

## 2020-01-25 NOTE — Telephone Encounter (Signed)
Per request, last 2 OV notes/Labs faxed to [facility] [phone])

## 2020-01-26 ENCOUNTER — Telehealth: Payer: Self-pay | Admitting: Cardiology

## 2020-01-26 NOTE — Telephone Encounter (Signed)
Echocardiogram sent per patient's request.

## 2020-01-26 NOTE — Telephone Encounter (Signed)
Patient wanted to know if we could send a copy of his recent Echocardiogram results to [facility]. Their office fax # is [phone]

## 2020-02-01 ENCOUNTER — Ambulatory Visit (INDEPENDENT_AMBULATORY_CARE_PROVIDER_SITE_OTHER): Payer: Medicare HMO

## 2020-02-01 ENCOUNTER — Other Ambulatory Visit: Payer: Self-pay

## 2020-02-01 DIAGNOSIS — I2510 Atherosclerotic heart disease of native coronary artery without angina pectoris: Secondary | ICD-10-CM | POA: Diagnosis not present

## 2020-02-01 MED ORDER — TECHNETIUM TC 99M TETROFOSMIN IV KIT
32.1000 | PACK | Freq: Once | INTRAVENOUS | Status: AC | PRN
  Administered 2020-02-01: 32.1 via INTRAVENOUS

## 2020-02-01 MED ORDER — REGADENOSON 0.4 MG/5ML IV SOLN
0.4000 mg | Freq: Once | INTRAVENOUS | Status: AC
  Administered 2020-02-01: 0.4 mg via INTRAVENOUS

## 2020-02-02 ENCOUNTER — Ambulatory Visit: Payer: Medicare HMO

## 2020-02-02 LAB — MYOCARDIAL PERFUSION IMAGING
LV dias vol: 158 mL (ref 62–150)
LV sys vol: 84 mL
Peak HR: 79 {beats}/min
Rest HR: 65 {beats}/min
SDS: 3
SRS: 1
SSS: 4
TID: 1.1

## 2020-02-02 MED ORDER — TECHNETIUM TC 99M TETROFOSMIN IV KIT
25.2000 | PACK | Freq: Once | INTRAVENOUS | Status: AC | PRN
  Administered 2020-02-02: 25.2 via INTRAVENOUS

## 2020-02-03 ENCOUNTER — Telehealth: Payer: Self-pay

## 2020-02-03 NOTE — Telephone Encounter (Signed)
Unable to reach or leave a message(VM full).

## 2020-02-03 NOTE — Telephone Encounter (Signed)
-----   Message from Park Liter, MD sent at 02/03/2020 10:25 AM EST ----- Stress test showing no evidence of ischemia, possibility of old myocardial infarction

## 2020-02-06 NOTE — Telephone Encounter (Signed)
Pt called in , returning [place] call   best [phone]

## 2020-02-07 ENCOUNTER — Telehealth: Payer: Self-pay

## 2020-02-07 NOTE — Telephone Encounter (Signed)
Per patient request, please fax stress test results to [facility]. Results faxed.

## 2020-02-07 NOTE — Telephone Encounter (Signed)
Patient notified of results.

## 2020-02-11 ENCOUNTER — Other Ambulatory Visit: Payer: Self-pay | Admitting: Cardiology

## 2020-02-11 DIAGNOSIS — I2510 Atherosclerotic heart disease of native coronary artery without angina pectoris: Secondary | ICD-10-CM

## 2020-02-23 ENCOUNTER — Other Ambulatory Visit: Payer: Self-pay

## 2020-02-23 DIAGNOSIS — M1990 Unspecified osteoarthritis, unspecified site: Secondary | ICD-10-CM | POA: Insufficient documentation

## 2020-02-23 DIAGNOSIS — R972 Elevated prostate specific antigen [PSA]: Secondary | ICD-10-CM | POA: Insufficient documentation

## 2020-02-23 DIAGNOSIS — G4733 Obstructive sleep apnea (adult) (pediatric): Secondary | ICD-10-CM | POA: Insufficient documentation

## 2020-02-23 DIAGNOSIS — I2510 Atherosclerotic heart disease of native coronary artery without angina pectoris: Secondary | ICD-10-CM | POA: Insufficient documentation

## 2020-02-23 DIAGNOSIS — Z87448 Personal history of other diseases of urinary system: Secondary | ICD-10-CM | POA: Insufficient documentation

## 2020-02-23 DIAGNOSIS — R351 Nocturia: Secondary | ICD-10-CM | POA: Insufficient documentation

## 2020-02-23 DIAGNOSIS — E785 Hyperlipidemia, unspecified: Secondary | ICD-10-CM | POA: Insufficient documentation

## 2020-03-05 ENCOUNTER — Ambulatory Visit: Payer: Medicare HMO | Admitting: Cardiology

## 2020-03-05 ENCOUNTER — Other Ambulatory Visit: Payer: Self-pay

## 2020-03-05 ENCOUNTER — Encounter: Payer: Self-pay | Admitting: Cardiology

## 2020-03-05 VITALS — BP 118/66 | HR 78 | Ht 69.0 in | Wt 299.0 lb

## 2020-03-05 DIAGNOSIS — I2510 Atherosclerotic heart disease of native coronary artery without angina pectoris: Secondary | ICD-10-CM

## 2020-03-05 DIAGNOSIS — G4733 Obstructive sleep apnea (adult) (pediatric): Secondary | ICD-10-CM

## 2020-03-05 DIAGNOSIS — E782 Mixed hyperlipidemia: Secondary | ICD-10-CM | POA: Diagnosis not present

## 2020-03-05 NOTE — Addendum Note (Signed)
Addended by: Senaida Ores on: 03/05/2020 10:52 AM   Modules accepted: Orders

## 2020-03-05 NOTE — Progress Notes (Signed)
Cardiology Office Note:    Date:  03/05/2020   ID:  Anthony Perkins, DOB [birth year], MRN [id number]  PCP:  Myrlene Broker, MD  Cardiologist:  Jenne Campus, MD    Referring MD: Myrlene Broker, MD   Chief Complaint  Patient presents with  . Follow-up  I am doing fine  History of Present Illness:    Anthony Perkins is a 80 y.o. male with past medical history significant for coronary artery disease, status post PTCA and stenting with drug-eluting stent to circumflex artery in 2016.  Also history of essential hypertension, obstructive sleep apnea but refuses CPAP mask, dyslipidemia. Comes today 2 months for follow-up overall cardiac wise doing well.  Denies have any chest pain tightness squeezing pressure burning chest, he is frustrated about his weight going up.  He said before bandemia he was able to go to gym on the regular basis now he does not he did not change any eating habits and he is gaining weight which make him upset he is determined to purchase some exercise equipment and start exercising and I strongly encouraged to do that.   Past Medical History:  Diagnosis Date  . Arthritis KNEES  . Coronary artery disease   . Coronary artery disease involving native coronary artery of native heart without angina pectoris 08/08/2014   Overview:  PTCA and stenting to circumflex artery at the beginning of 2016  . Decreased hearing of right ear 07/14/2018  . History of cellulitis of skin with lymphangitis 2004     STATES WEARS TED HOSE DAILY--  NO SWELLING BUT SKIN IS RED  SINCE 2004 EPISODE  . History of urinary tract obstruction SECONDARY BPH  . Hypercholesterolemia 08/08/2014  . Hyperlipidemia   . Hypertension 02/15/2016  . Nocturia   . Obstructive sleep apnea syndrome 08/08/2014  . OSA (obstructive sleep apnea)   . Prostate cancer ([place]) 12/20/10   gleason 6, vol 42 cc  . PSA elevation   . Right chronic serous otitis media 08/02/2018  . Vasomotor rhinitis 08/02/2018    Past  Surgical History:  Procedure Laterality Date  . CARDIAC CATHETERIZATION    . CYSTOSCOPY  07/30/2011   Procedure: CYSTOSCOPY FLEXIBLE;  Surgeon: Joie Bimler, MD;  Location: [facility];  Service: Urology;  Laterality: N/A;  no seeds found in bladder  . MELANOMA EXCISION  1997   LEFT EAR  . PROSTATE BIOPSY  12/20/10  DR Nila Nephew 'S OFFICE   Gleason 6, vol 42 cc  . RADIOACTIVE SEED IMPLANT  07/30/2011   Procedure: RADIOACTIVE SEED IMPLANT;  Surgeon: Joie Bimler, MD;  Location: [facility];  Service: Urology;  Laterality: N/A;  64 seeds implanted  . ROTATOR CUFF REPAIR  NOV 2011   RIGHT SHOULDER  . TONSILLECTOMY AND ADENOIDECTOMY  1948    Current Medications: Current Meds  Medication Sig  . aspirin EC 81 MG tablet Take 81 mg by mouth daily.  Marland Kitchen atorvastatin (LIPITOR) 80 MG tablet TAKE 1 TABLET BY MOUTH EVERY DAY  . clopidogrel (PLAVIX) 75 MG tablet TAKE 1 TABLET BY MOUTH EVERY DAY  . clotrimazole-betamethasone (LOTRISONE) cream Apply 1 application topically daily.  . finasteride (PROSCAR) 5 MG tablet Take 5 mg by mouth daily.  . fluorouracil (EFUDEX) 5 % cream Apply 1 application topically in the morning and at bedtime.  Marland Kitchen lisinopril (ZESTRIL) 2.5 MG tablet TAKE 1 TABLET BY MOUTH EVERY DAY  . nitroGLYCERIN (NITROSTAT) 0.4 MG SL tablet Place 1 tablet (0.4 mg  total) under the tongue as needed for chest pain.  . ranolazine (RANEXA) 500 MG 12 hr tablet TAKE 1 TABLET BY MOUTH TWICE A DAY  . tamsulosin (FLOMAX) 0.4 MG CAPS capsule Take 0.4 mg by mouth daily.     Allergies:   Hydrocodone   Social History   Socioeconomic History  . Marital status: Married    Spouse name: Not on file  . Number of children: Not on file  . Years of education: Not on file  . Highest education level: Not on file  Occupational History  . Not on file  Tobacco Use  . Smoking status: Never Smoker  . Smokeless tobacco: Never Used  Vaping Use  . Vaping Use: Never used  Substance and  Sexual Activity  . Alcohol use: No  . Drug use: No  . Sexual activity: Not on file  Other Topics Concern  . Not on file  Social History Narrative   Married, 3 children, truck Geophysicist/field seismologist            Social Determinants of Radio broadcast assistant Strain: Not on file  Food Insecurity: Not on file  Transportation Needs: Not on file  Physical Activity: Not on file  Stress: Not on file  Social Connections: Not on file     Family History: The patient's family history includes Cancer in his father and paternal grandmother. ROS:   Please see the history of present illness.    All 14 point review of systems negative except as described per history of present illness  EKGs/Labs/Other Studies Reviewed:    Stress test done just few weeks ago showed ago showed:  The left ventricular ejection fraction is mildly decreased (45-54%).  Nuclear stress EF: 47%.  There was no ST segment deviation noted during stress.  No T wave inversion was noted during stress.  Defect 1: There is a small defect of mild severity present in the basal inferior location.  This is an intermediate risk study.  Findings consistent with prior myocardial infarction.   Echocardiogram done in March of last year showed: 1. Left ventricular ejection fraction, by estimation, is 55 to 60%. The  left ventricle has normal function. The left ventricle has no regional  wall motion abnormalities. There is mild concentric left ventricular  hypertrophy. Left ventricular diastolic  parameters are consistent with Grade I diastolic dysfunction (impaired  relaxation).  2. Right ventricular systolic function is normal. The right ventricular  size is normal.  3. Left atrial size was mildly dilated.  4. The mitral valve is normal in structure. No evidence of mitral valve  regurgitation. No evidence of mitral stenosis.  5. The aortic valve is normal in structure. Aortic valve regurgitation is  not visualized. No aortic  stenosis is present.  6. Unable to determine Pulmonary Artery Systolic Pressure, No TR jet  spectral display.   Recent Labs: No results found for requested labs within last 8760 hours.  Recent Lipid Panel    Component Value Date/Time   CHOL 113 09/05/2019 1036   TRIG 171 (H) 09/05/2019 1036   HDL 36 (L) 09/05/2019 1036   CHOLHDL 3.1 09/05/2019 1036   LDLCALC 48 09/05/2019 1036    Physical Exam:    VS:  There were no vitals taken for this visit.    Wt Readings from Last 3 Encounters:  02/01/20 295 lb (133.8 kg)  09/05/19 295 lb (133.8 kg)  12/20/18 289 lb 12.8 oz (131.5 kg)     GEN:  Well  nourished, well developed in no acute distress HEENT: Normal NECK: No JVD; No carotid bruits LYMPHATICS: No lymphadenopathy CARDIAC: RRR, no murmurs, no rubs, no gallops RESPIRATORY:  Clear to auscultation without rales, wheezing or rhonchi  ABDOMEN: Soft, non-tender, non-distended MUSCULOSKELETAL:  No edema; No deformity  SKIN: Warm and dry LOWER EXTREMITIES: no swelling NEUROLOGIC:  Alert and oriented x 3 PSYCHIATRIC:  Normal affect   ASSESSMENT:    1. Coronary artery disease involving native coronary artery of native heart without angina pectoris   2. OSA (obstructive sleep apnea)   3. Mixed hyperlipidemia    PLAN:    In order of problems listed above:  1. Coronary artery disease stable from that point review stress test reviewed showed no evidence of ischemia surprisingly showing some possibility of old myocardial infarction.  I will schedule him to have echocardiogram to assess left ventricle ejection fraction see if there are any segmental motion normalities. 2. Obstructive sleep apnea refused to wear equipment, now worse with weight gain.  Obviously disappointing. 3. Dyslipidemia: I do have his K PN showing LDL of 48 HDL 36 this is from August 2021.  He is on high intense statin which I will continue. 4. We did talk about healthy lifestyle need to exercise on the regular  basis which he will try to do.   Medication Adjustments/Labs and Tests Ordered: Current medicines are reviewed at length with the patient today.  Concerns regarding medicines are outlined above.  No orders of the defined types were placed in this encounter.  Medication changes: No orders of the defined types were placed in this encounter.   Signed, Park Liter, MD, [facility] 03/05/2020 10:29 AM    [place]

## 2020-03-05 NOTE — Patient Instructions (Signed)
Medication Instructions:  Your physician recommends that you continue on your current medications as directed. Please refer to the Current Medication list given to you today.   *If you need a refill on your cardiac medications before your next appointment, please call your pharmacy*   Lab Work: none If you have labs (blood work) drawn today and your tests are completely normal, you will receive your results only by: Marland Kitchen MyChart Message (if you have MyChart) OR . A paper copy in the mail If you have any lab test that is abnormal or we need to change your treatment, we will call you to review the results.   Testing/Procedures: Your physician has requested that you have an echocardiogram. Echocardiography is a painless test that uses sound waves to create images of your heart. It provides your doctor with information about the size and shape of your heart and how well your heart's chambers and valves are working. This procedure takes approximately one hour. There are no restrictions for this procedure.     Follow-Up: At [facility], you and your health needs are our priority.  As part of our continuing mission to provide you with exceptional heart care, we have created designated Provider Care Teams.  These Care Teams include your primary Cardiologist (physician) and Advanced Practice Providers (APPs -  Physician Assistants and Nurse Practitioners) who all work together to provide you with the care you need, when you need it.  We recommend signing up for the patient portal called "MyChart".  Sign up information is provided on this After Visit Summary.  MyChart is used to connect with patients for Virtual Visits (Telemedicine).  Patients are able to view lab/test results, encounter notes, upcoming appointments, etc.  Non-urgent messages can be sent to your provider as well.   To learn more about what you can do with MyChart, go to [email or web address].    Your next appointment:   6  month(s)  The format for your next appointment:   In Person  Provider:   Jenne Campus, MD   Other Instructions   Echocardiogram An echocardiogram is a test that uses sound waves (ultrasound) to produce images of the heart. Images from an echocardiogram can provide important information about:  Heart size and shape.  The size and thickness and movement of your heart's walls.  Heart muscle function and strength.  Heart valve function or if you have stenosis. Stenosis is when the heart valves are too narrow.  If blood is flowing backward through the heart valves (regurgitation).  A tumor or infectious growth around the heart valves.  Areas of heart muscle that are not working well because of poor blood flow or injury from a heart attack.  Aneurysm detection. An aneurysm is a weak or damaged part of an artery wall. The wall bulges out from the normal force of blood pumping through the body. Tell a health care provider about:  Any allergies you have.  All medicines you are taking, including vitamins, herbs, eye drops, creams, and over-the-counter medicines.  Any blood disorders you have.  Any surgeries you have had.  Any medical conditions you have.  Whether you are pregnant or may be pregnant. What are the risks? Generally, this is a safe test. However, problems may occur, including an allergic reaction to dye (contrast) that may be used during the test. What happens before the test? No specific preparation is needed. You may eat and drink normally. What happens during the test?  You will take off  your clothes from the waist up and put on a hospital gown.  Electrodes or electrocardiogram (ECG)patches may be placed on your chest. The electrodes or patches are then connected to a device that monitors your heart rate and rhythm.  You will lie down on a table for an ultrasound exam. A gel will be applied to your chest to help sound waves pass through your skin.  A  handheld device, called a transducer, will be pressed against your chest and moved over your heart. The transducer produces sound waves that travel to your heart and bounce back (or "echo" back) to the transducer. These sound waves will be captured in real-time and changed into images of your heart that can be viewed on a video monitor. The images will be recorded on a computer and reviewed by your health care provider.  You may be asked to change positions or hold your breath for a short time. This makes it easier to get different views or better views of your heart.  In some cases, you may receive contrast through an IV in one of your veins. This can improve the quality of the pictures from your heart. The procedure may vary among health care providers and hospitals.   What can I expect after the test? You may return to your normal, everyday life, including diet, activities, and medicines, unless your health care provider tells you not to do that. Follow these instructions at home:  It is up to you to get the results of your test. Ask your health care provider, or the department that is doing the test, when your results will be ready.  Keep all follow-up visits. This is important. Summary  An echocardiogram is a test that uses sound waves (ultrasound) to produce images of the heart.  Images from an echocardiogram can provide important information about the size and shape of your heart, heart muscle function, heart valve function, and other possible heart problems.  You do not need to do anything to prepare before this test. You may eat and drink normally.  After the echocardiogram is completed, you may return to your normal, everyday life, unless your health care provider tells you not to do that. This information is not intended to replace advice given to you by your health care provider. Make sure you discuss any questions you have with your health care provider. Document Revised:  08/16/2019 Document Reviewed: 08/16/2019 Elsevier Patient Education  2021 [employer].

## 2020-04-03 ENCOUNTER — Ambulatory Visit (INDEPENDENT_AMBULATORY_CARE_PROVIDER_SITE_OTHER): Payer: Medicare HMO

## 2020-04-03 ENCOUNTER — Other Ambulatory Visit: Payer: Self-pay

## 2020-04-03 DIAGNOSIS — E782 Mixed hyperlipidemia: Secondary | ICD-10-CM | POA: Diagnosis not present

## 2020-04-03 DIAGNOSIS — I2510 Atherosclerotic heart disease of native coronary artery without angina pectoris: Secondary | ICD-10-CM | POA: Diagnosis not present

## 2020-04-03 DIAGNOSIS — G4733 Obstructive sleep apnea (adult) (pediatric): Secondary | ICD-10-CM | POA: Diagnosis not present

## 2020-04-03 LAB — ECHOCARDIOGRAM COMPLETE
Area-P 1/2: 3.24 cm2
Calc EF: 51.6 %
S' Lateral: 3.3 cm
Single Plane A2C EF: 54.8 %
Single Plane A4C EF: 46.1 %

## 2020-04-09 ENCOUNTER — Telehealth: Payer: Self-pay | Admitting: Cardiology

## 2020-04-09 NOTE — Telephone Encounter (Signed)
Spoke to patient informed him of results.

## 2020-04-09 NOTE — Progress Notes (Signed)
Tried to call patient this morning. Left message for patient to return call.

## 2020-04-09 NOTE — Telephone Encounter (Signed)
New message:    Patient calling to get results for a ECHO.

## 2020-04-09 NOTE — Telephone Encounter (Signed)
Tried to call patient back. No answer and voicemail was full.

## 2020-04-13 DIAGNOSIS — H906 Mixed conductive and sensorineural hearing loss, bilateral: Secondary | ICD-10-CM | POA: Diagnosis not present

## 2020-04-25 DIAGNOSIS — H52203 Unspecified astigmatism, bilateral: Secondary | ICD-10-CM | POA: Diagnosis not present

## 2020-04-25 DIAGNOSIS — R351 Nocturia: Secondary | ICD-10-CM | POA: Insufficient documentation

## 2020-04-25 DIAGNOSIS — H524 Presbyopia: Secondary | ICD-10-CM | POA: Diagnosis not present

## 2020-04-25 DIAGNOSIS — H2513 Age-related nuclear cataract, bilateral: Secondary | ICD-10-CM | POA: Diagnosis not present

## 2020-04-25 DIAGNOSIS — G4733 Obstructive sleep apnea (adult) (pediatric): Secondary | ICD-10-CM | POA: Insufficient documentation

## 2020-04-25 DIAGNOSIS — R972 Elevated prostate specific antigen [PSA]: Secondary | ICD-10-CM | POA: Insufficient documentation

## 2020-04-25 DIAGNOSIS — H5203 Hypermetropia, bilateral: Secondary | ICD-10-CM | POA: Diagnosis not present

## 2020-04-25 DIAGNOSIS — M1990 Unspecified osteoarthritis, unspecified site: Secondary | ICD-10-CM | POA: Insufficient documentation

## 2020-06-13 DIAGNOSIS — R42 Dizziness and giddiness: Secondary | ICD-10-CM | POA: Diagnosis not present

## 2020-06-13 DIAGNOSIS — R2681 Unsteadiness on feet: Secondary | ICD-10-CM | POA: Diagnosis not present

## 2020-06-13 DIAGNOSIS — M542 Cervicalgia: Secondary | ICD-10-CM | POA: Diagnosis not present

## 2020-06-13 DIAGNOSIS — H8113 Benign paroxysmal vertigo, bilateral: Secondary | ICD-10-CM | POA: Diagnosis not present

## 2020-06-15 ENCOUNTER — Other Ambulatory Visit: Payer: Self-pay | Admitting: Cardiology

## 2020-06-22 DIAGNOSIS — M5090 Cervical disc disorder, unspecified, unspecified cervical region: Secondary | ICD-10-CM | POA: Diagnosis not present

## 2020-06-22 DIAGNOSIS — M47812 Spondylosis without myelopathy or radiculopathy, cervical region: Secondary | ICD-10-CM | POA: Diagnosis not present

## 2020-07-02 DIAGNOSIS — M542 Cervicalgia: Secondary | ICD-10-CM | POA: Diagnosis not present

## 2020-07-02 DIAGNOSIS — M5032 Other cervical disc degeneration, mid-cervical region, unspecified level: Secondary | ICD-10-CM | POA: Diagnosis not present

## 2020-07-04 DIAGNOSIS — M5032 Other cervical disc degeneration, mid-cervical region, unspecified level: Secondary | ICD-10-CM | POA: Diagnosis not present

## 2020-07-04 DIAGNOSIS — M542 Cervicalgia: Secondary | ICD-10-CM | POA: Diagnosis not present

## 2020-07-10 DIAGNOSIS — M5032 Other cervical disc degeneration, mid-cervical region, unspecified level: Secondary | ICD-10-CM | POA: Diagnosis not present

## 2020-07-10 DIAGNOSIS — M542 Cervicalgia: Secondary | ICD-10-CM | POA: Diagnosis not present

## 2020-07-12 DIAGNOSIS — M5032 Other cervical disc degeneration, mid-cervical region, unspecified level: Secondary | ICD-10-CM | POA: Diagnosis not present

## 2020-07-12 DIAGNOSIS — M542 Cervicalgia: Secondary | ICD-10-CM | POA: Diagnosis not present

## 2020-07-16 DIAGNOSIS — C61 Malignant neoplasm of prostate: Secondary | ICD-10-CM | POA: Diagnosis not present

## 2020-08-02 DIAGNOSIS — M47812 Spondylosis without myelopathy or radiculopathy, cervical region: Secondary | ICD-10-CM | POA: Diagnosis not present

## 2020-08-02 DIAGNOSIS — M5090 Cervical disc disorder, unspecified, unspecified cervical region: Secondary | ICD-10-CM | POA: Diagnosis not present

## 2020-08-17 ENCOUNTER — Other Ambulatory Visit: Payer: Self-pay | Admitting: Cardiology

## 2020-08-28 ENCOUNTER — Other Ambulatory Visit: Payer: Self-pay | Admitting: Cardiology

## 2020-08-28 ENCOUNTER — Telehealth: Payer: Self-pay | Admitting: Cardiology

## 2020-08-28 DIAGNOSIS — I2510 Atherosclerotic heart disease of native coronary artery without angina pectoris: Secondary | ICD-10-CM

## 2020-08-28 NOTE — Telephone Encounter (Signed)
Refill sent.

## 2020-08-28 NOTE — Telephone Encounter (Signed)
*  STAT* If patient is at the pharmacy, call can be transferred to refill team.   1. Which medications need to be refilled? (please list name of each medication and dose if known)  ranolazine (RANEXA) 500 MG 12 hr tablet  2. Which pharmacy/location (including street and city if local pharmacy) is medication to be sent to?  [place], [place]  3. Do they need a 30 day or 90 day supply? [place]

## 2020-09-11 ENCOUNTER — Other Ambulatory Visit: Payer: Self-pay

## 2020-09-11 ENCOUNTER — Ambulatory Visit: Payer: Medicare HMO | Admitting: Cardiology

## 2020-09-11 VITALS — BP 142/68 | HR 68 | Ht 72.0 in | Wt 300.0 lb

## 2020-09-11 DIAGNOSIS — G4733 Obstructive sleep apnea (adult) (pediatric): Secondary | ICD-10-CM | POA: Diagnosis not present

## 2020-09-11 DIAGNOSIS — E7800 Pure hypercholesterolemia, unspecified: Secondary | ICD-10-CM | POA: Diagnosis not present

## 2020-09-11 DIAGNOSIS — I2510 Atherosclerotic heart disease of native coronary artery without angina pectoris: Secondary | ICD-10-CM | POA: Diagnosis not present

## 2020-09-11 DIAGNOSIS — I10 Essential (primary) hypertension: Secondary | ICD-10-CM | POA: Diagnosis not present

## 2020-09-11 DIAGNOSIS — Z125 Encounter for screening for malignant neoplasm of prostate: Secondary | ICD-10-CM | POA: Diagnosis not present

## 2020-09-11 NOTE — Patient Instructions (Signed)
Medication Instructions:  Your physician recommends that you continue on your current medications as directed. Please refer to the Current Medication list given to you today.  *If you need a refill on your cardiac medications before your next appointment, please call your pharmacy*   Lab Work: Your physician recommends that you return for lab work in: TODAY CBC, CMP, PSA, Lipids If you have labs (blood work) drawn today and your tests are completely normal, you will receive your results only by: [place] (if you have [place]) OR A paper copy in the mail If you have any lab test that is abnormal or we need to change your treatment, we will call you to review the results.   Testing/Procedures: None   Follow-Up: At [facility], you and your health needs are our priority.  As part of our continuing mission to provide you with exceptional heart care, we have created designated Provider Care Teams.  These Care Teams include your primary Cardiologist (physician) and Advanced Practice Providers (APPs -  Physician Assistants and Nurse Practitioners) who all work together to provide you with the care you need, when you need it.  We recommend signing up for the patient portal called "MyChart".  Sign up information is provided on this After Visit Summary.  MyChart is used to connect with patients for Virtual Visits (Telemedicine).  Patients are able to view lab/test results, encounter notes, upcoming appointments, etc.  Non-urgent messages can be sent to your provider as well.   To learn more about what you can do with MyChart, go to [email or web address].    Your next appointment:   6 month(s)  The format for your next appointment:   In Person  Provider:   Jenne Campus, MD   Other Instructions

## 2020-09-11 NOTE — Progress Notes (Signed)
Cardiology Office Note:    Date:  09/11/2020   ID:  Anthony Perkins, DOB Jul 21, 1940, MRN [id number]  PCP:  Myrlene Broker, MD  Cardiologist:  Jenne Campus, MD    Referring MD: Myrlene Broker, MD   Chief Complaint  Patient presents with   Clearance TBD    History of Present Illness:    Anthony Perkins is a 80 y.o. male  with past medical history significant for coronary artery disease, status post PTCA and stenting with drug-eluting stent to circumflex artery in 2016.  Also history of essential hypertension, obstructive sleep apnea but refuses CPAP mask, dyslipidemia. He comes today to my office for follow-up.  Overall he seems to be doing well.  He denies have any chest pain tightness squeezing pressure burning chest.  He admits that he gained few pounds and is making difficult to move and do things around.  He is also planning to have some dental procedure done due to he talking about removal of multiple teeth because he cannot afford implants and he will have dentures.  He will make sure from my point of view it is feasible to do.  Past Medical History:  Diagnosis Date   Arthritis KNEES   Coronary artery disease    Coronary artery disease involving native coronary artery of native heart without angina pectoris 08/08/2014   Overview:  PTCA and stenting to circumflex artery at the beginning of 2016   Decreased hearing of right ear 07/14/2018   History of cellulitis of skin with lymphangitis 2004     STATES WEARS TED HOSE DAILY--  NO SWELLING BUT SKIN IS RED  SINCE 2004 EPISODE   History of urinary tract obstruction SECONDARY BPH   Hypercholesterolemia 08/08/2014   Hyperlipidemia    Hypertension 02/15/2016   Nocturia    Obstructive sleep apnea syndrome 08/08/2014   OSA (obstructive sleep apnea)    Prostate cancer ([place]) 12/20/10   gleason 6, vol 42 cc   PSA elevation    Right chronic serous otitis media 08/02/2018   Vasomotor rhinitis 08/02/2018    Past Surgical History:   Procedure Laterality Date   CARDIAC CATHETERIZATION     CYSTOSCOPY  07/30/2011   Procedure: CYSTOSCOPY FLEXIBLE;  Surgeon: Joie Bimler, MD;  Location: [facility];  Service: Urology;  Laterality: N/A;  no seeds found in bladder   [place] BIOPSY  12/20/10  DR Nila Nephew 'S OFFICE   Gleason 6, vol 42 cc   RADIOACTIVE SEED IMPLANT  07/30/2011   Procedure: RADIOACTIVE SEED IMPLANT;  Surgeon: Joie Bimler, MD;  Location: [facility];  Service: Urology;  Laterality: N/A;  64 seeds implanted   ROTATOR CUFF REPAIR  NOV 2011   RIGHT SHOULDER   TONSILLECTOMY AND ADENOIDECTOMY  1948    Current Medications: Current Meds  Medication Sig   aspirin EC 81 MG tablet Take 81 mg by mouth daily.   atorvastatin (LIPITOR) 80 MG tablet Take 1 tablet (80 mg total) by mouth daily.   clopidogrel (PLAVIX) 75 MG tablet TAKE 1 TABLET BY MOUTH EVERY DAY (Patient taking differently: Take 75 mg by mouth daily.)   clotrimazole-betamethasone (LOTRISONE) cream Apply 1 application topically daily.   finasteride (PROSCAR) 5 MG tablet Take 5 mg by mouth daily.   fluorouracil (EFUDEX) 5 % cream Apply 1 application topically in the morning and at bedtime.   lisinopril (ZESTRIL) 2.5 MG tablet TAKE 1 TABLET BY  MOUTH EVERY DAY (Patient taking differently: Take 2.5 mg by mouth daily.)   nitroGLYCERIN (NITROSTAT) 0.4 MG SL tablet Place 1 tablet (0.4 mg total) under the tongue as needed for chest pain.   ranolazine (RANEXA) 500 MG 12 hr tablet Take 1 tablet by mouth twice daily (Patient taking differently: Take 500 mg by mouth 2 (two) times daily.)   tamsulosin (FLOMAX) 0.4 MG CAPS capsule Take 0.4 mg by mouth daily.     Allergies:   Hydrocodone   Social History   Socioeconomic History   Marital status: Married    Spouse name: Not on file   Number of children: Not on file   Years of education: Not on file   Highest education level: Not on file  Occupational  History   Not on file  Tobacco Use   Smoking status: Never   Smokeless tobacco: Never  Vaping Use   Vaping Use: Never used  Substance and Sexual Activity   Alcohol use: No   Drug use: No   Sexual activity: Not on file  Other Topics Concern   Not on file  Social History Narrative   Married, 3 children, truck Geophysicist/field seismologist            Social Determinants of Health   Financial Resource Strain: Not on file  Food Insecurity: Not on file  Transportation Needs: Not on file  Physical Activity: Not on file  Stress: Not on file  Social Connections: Not on file     Family History: The patient's family history includes Cancer in his father and paternal grandmother. ROS:   Please see the history of present illness.    All 14 point review of systems negative except as described per history of present illness  EKGs/Labs/Other Studies Reviewed:      Recent Labs: No results found for requested labs within last 8760 hours.  Recent Lipid Panel    Component Value Date/Time   CHOL 113 09/05/2019 1036   TRIG 171 (H) 09/05/2019 1036   HDL 36 (L) 09/05/2019 1036   CHOLHDL 3.1 09/05/2019 1036   LDLCALC 48 09/05/2019 1036    Physical Exam:    VS:  BP (!) 142/68 (BP Location: Right Arm, Patient Position: Sitting)   Pulse 68   Ht 6' (1.829 m)   Wt 300 lb (136.1 kg)   SpO2 94%   BMI 40.69 kg/m     Wt Readings from Last 3 Encounters:  09/11/20 300 lb (136.1 kg)  03/05/20 299 lb (135.6 kg)  02/01/20 295 lb (133.8 kg)     GEN:  Well nourished, well developed in no acute distress HEENT: Normal NECK: No JVD; No carotid bruits LYMPHATICS: No lymphadenopathy CARDIAC: RRR, no murmurs, no rubs, no gallops RESPIRATORY:  Clear to auscultation without rales, wheezing or rhonchi  ABDOMEN: Soft, non-tender, non-distended MUSCULOSKELETAL:  No edema; No deformity  SKIN: Warm and dry LOWER EXTREMITIES: no swelling NEUROLOGIC:  Alert and oriented x 3 PSYCHIATRIC:  Normal affect   ASSESSMENT:     1. Coronary artery disease involving native coronary artery of native heart without angina pectoris   2. Primary hypertension   3. Obstructive sleep apnea syndrome   4. OSA (obstructive sleep apnea)   5. Hypercholesterolemia    PLAN:    In order of problems listed above:  Coronary artery disease stable from that point review and appropriate medication which I will continue.  2.  Essential hypertension blood pressure well controlled continue present management.    3.  Dyslipidemia I do have data from K PN from August 2021 which is a year ago when his LDL was 48 and HDL 36.  We will check his fasting lipid profile today in the meantime we will continue high intensity statin form of Lipitor 80.  4.  Obstructive sleep apnea: He does not use CPAP mask he does not want to do it and I try to convince him many times to do it he never accepted this. 5.  Hyperlipidemia plan is to as described above 6. Cardiovascular preop evaluation in terms of his dental work that he needs to have done there is no complication from heart point review.  I recommended that if he needed to stop Plavix about 5 to 7 days before procedure  Medication Adjustments/Labs and Tests Ordered: Current medicines are reviewed at length with the patient today.  Concerns regarding medicines are outlined above.  No orders of the defined types were placed in this encounter.  Medication changes: No orders of the defined types were placed in this encounter.   Signed, Park Liter, MD, [facility] 09/11/2020 2:26 PM    [place]

## 2020-09-12 ENCOUNTER — Telehealth: Payer: Self-pay

## 2020-09-12 LAB — COMPREHENSIVE METABOLIC PANEL
ALT: 24 IU/L (ref 0–44)
AST: 27 IU/L (ref 0–40)
Albumin/Globulin Ratio: 2.2 (ref 1.2–2.2)
Albumin: 4.1 g/dL (ref 3.7–4.7)
Alkaline Phosphatase: 77 IU/L (ref 44–121)
BUN/Creatinine Ratio: 19 (ref 10–24)
BUN: 17 mg/dL (ref 8–27)
Bilirubin Total: 0.7 mg/dL (ref 0.0–1.2)
CO2: 24 mmol/L (ref 20–29)
Calcium: 8.8 mg/dL (ref 8.6–10.2)
Chloride: 101 mmol/L (ref 96–106)
Creatinine, Ser: 0.88 mg/dL (ref 0.76–1.27)
Globulin, Total: 1.9 g/dL (ref 1.5–4.5)
Glucose: 120 mg/dL — ABNORMAL HIGH (ref 65–99)
Potassium: 4.8 mmol/L (ref 3.5–5.2)
Sodium: 137 mmol/L (ref 134–144)
Total Protein: 6 g/dL (ref 6.0–8.5)
eGFR: 87 mL/min/{1.73_m2} (ref >59)

## 2020-09-12 LAB — LIPID PANEL
Chol/HDL Ratio: 2.8 ratio (ref 0.0–5.0)
Cholesterol, Total: 108 mg/dL (ref 100–199)
HDL: 38 mg/dL — ABNORMAL LOW (ref >39)
LDL Chol Calc (NIH): 52 mg/dL (ref 0–99)
Triglycerides: 92 mg/dL (ref 0–149)
VLDL Cholesterol Cal: 18 mg/dL (ref 5–40)

## 2020-09-12 LAB — CBC
Hematocrit: 38.7 % (ref 37.5–51.0)
Hemoglobin: 13.6 g/dL (ref 13.0–17.7)
MCH: 32.8 pg (ref 26.6–33.0)
MCHC: 35.1 g/dL (ref 31.5–35.7)
MCV: 93 fL (ref 79–97)
Platelets: 178 10*3/uL (ref 150–450)
RBC: 4.15 x10E6/uL (ref 4.14–5.80)
RDW: 13.4 % (ref 11.6–15.4)
WBC: 6.6 10*3/uL (ref 3.4–10.8)

## 2020-09-12 LAB — PSA: Prostate Specific Ag, Serum: <0.1 ng/mL (ref 0.0–4.0)

## 2020-09-12 NOTE — Telephone Encounter (Signed)
Left message on patients voicemail to please return our call.   

## 2020-09-12 NOTE — Telephone Encounter (Signed)
-----   Message from Park Liter, MD sent at 09/12/2020 12:33 PM EDT ----- All laboratory tests are looking good including cholesterol and his PSA

## 2020-09-13 ENCOUNTER — Other Ambulatory Visit: Payer: Self-pay | Admitting: Cardiology

## 2020-09-13 ENCOUNTER — Telehealth: Payer: Self-pay

## 2020-09-13 NOTE — Telephone Encounter (Signed)
Patient notified of results.

## 2020-09-13 NOTE — Telephone Encounter (Signed)
-----   Message from Park Liter, MD sent at 09/12/2020 12:33 PM EDT ----- All laboratory tests are looking good including cholesterol and his PSA

## 2020-09-13 NOTE — Telephone Encounter (Signed)
Left message on patients voicemail to please return our call.   

## 2020-09-13 NOTE — Telephone Encounter (Signed)
Anthony Perkins is returning [employer]. Please advise.

## 2020-10-23 DIAGNOSIS — M170 Bilateral primary osteoarthritis of knee: Secondary | ICD-10-CM | POA: Diagnosis not present

## 2020-10-23 DIAGNOSIS — Z6841 Body Mass Index (BMI) 40.0 and over, adult: Secondary | ICD-10-CM | POA: Diagnosis not present

## 2020-10-25 ENCOUNTER — Other Ambulatory Visit: Payer: Self-pay | Admitting: Cardiology

## 2020-12-03 DIAGNOSIS — M2170 Unequal limb length (acquired), unspecified site: Secondary | ICD-10-CM | POA: Diagnosis not present

## 2020-12-03 DIAGNOSIS — M170 Bilateral primary osteoarthritis of knee: Secondary | ICD-10-CM | POA: Diagnosis not present

## 2021-01-28 DIAGNOSIS — M1712 Unilateral primary osteoarthritis, left knee: Secondary | ICD-10-CM | POA: Diagnosis not present

## 2021-01-28 DIAGNOSIS — M170 Bilateral primary osteoarthritis of knee: Secondary | ICD-10-CM | POA: Diagnosis not present

## 2021-02-16 ENCOUNTER — Other Ambulatory Visit: Payer: Self-pay | Admitting: Cardiology

## 2021-02-18 DIAGNOSIS — J0100 Acute maxillary sinusitis, unspecified: Secondary | ICD-10-CM | POA: Diagnosis not present

## 2021-02-18 DIAGNOSIS — R0981 Nasal congestion: Secondary | ICD-10-CM | POA: Diagnosis not present

## 2021-02-18 DIAGNOSIS — C61 Malignant neoplasm of prostate: Secondary | ICD-10-CM | POA: Diagnosis not present

## 2021-02-25 ENCOUNTER — Other Ambulatory Visit: Payer: Self-pay | Admitting: Cardiology

## 2021-02-25 DIAGNOSIS — I2510 Atherosclerotic heart disease of native coronary artery without angina pectoris: Secondary | ICD-10-CM

## 2021-03-18 ENCOUNTER — Ambulatory Visit: Payer: Medicare HMO | Admitting: Cardiology

## 2021-03-18 ENCOUNTER — Encounter: Payer: Self-pay | Admitting: Cardiology

## 2021-03-18 ENCOUNTER — Other Ambulatory Visit: Payer: Self-pay

## 2021-03-18 VITALS — BP 136/74 | HR 70 | Ht 72.0 in | Wt 280.6 lb

## 2021-03-18 DIAGNOSIS — G4733 Obstructive sleep apnea (adult) (pediatric): Secondary | ICD-10-CM | POA: Diagnosis not present

## 2021-03-18 DIAGNOSIS — E7800 Pure hypercholesterolemia, unspecified: Secondary | ICD-10-CM

## 2021-03-18 DIAGNOSIS — I10 Essential (primary) hypertension: Secondary | ICD-10-CM | POA: Diagnosis not present

## 2021-03-18 DIAGNOSIS — I2510 Atherosclerotic heart disease of native coronary artery without angina pectoris: Secondary | ICD-10-CM | POA: Diagnosis not present

## 2021-03-18 NOTE — Progress Notes (Signed)
?Cardiology Office Note:   ? ?Date:  03/18/2021  ? ?ID:  Anthony Perkins, DOB Oct 24, 1940, MRN [id number] ? ?PCP:  Myrlene Broker, MD  ?Cardiologist:  Jenne Campus, MD   ? ?Referring MD: Myrlene Broker, MD  ? ?No chief complaint on file. ?I am doing well ? ?History of Present Illness:   ? ?Anthony Perkins is a 81 y.o. male with past medical history significant for coronary artery disease.  He did have PTCA and stenting with drug-eluting stent of circumflex artery in 2016.  Also have history of essential hypertension, obstructive sleep apnea but he refused CPAP mask, dyslipidemia. ?He is coming to my office today for follow-up.  Overall doing well.  He denies have any chest pain, tightness, pressure, burning in the chest.  Denies have any palpitations.  Still does not always use CPAP mask for his sleep apnea. ? ? ?Past Medical History:  ?Diagnosis Date  ? Arthritis KNEES  ? Coronary artery disease   ? Coronary artery disease involving native coronary artery of native heart without angina pectoris 08/08/2014  ? Overview:  PTCA and stenting to circumflex artery at the beginning of 2016  ? Decreased hearing of right ear 07/14/2018  ? History of cellulitis of skin with lymphangitis 2004    ? STATES WEARS TED HOSE DAILY--  NO SWELLING BUT SKIN IS RED  SINCE 2004 EPISODE  ? History of urinary tract obstruction SECONDARY BPH  ? Hypercholesterolemia 08/08/2014  ? Hyperlipidemia   ? Hypertension 02/15/2016  ? Nocturia   ? Obstructive sleep apnea syndrome 08/08/2014  ? OSA (obstructive sleep apnea)   ? Prostate cancer ([place]) 12/20/10  ? gleason 6, vol 42 cc  ? PSA elevation   ? Right chronic serous otitis media 08/02/2018  ? Vasomotor rhinitis 08/02/2018  ? ? ?Past Surgical History:  ?Procedure Laterality Date  ? CARDIAC CATHETERIZATION    ? CYSTOSCOPY  07/30/2011  ? Procedure: CYSTOSCOPY FLEXIBLE;  Surgeon: Joie Bimler, MD;  Location[facility];  Service: Urology;  Laterality: N/A;  no seeds found in bladder  ?  MELANOMA EXCISION  1997  ? LEFT EAR  ? PROSTATE BIOPSY  12/20/10  DR Nila Nephew 'S OFFICE  ? Gleason 6, vol 42 cc  ? RADIOACTIVE SEED IMPLANT  07/30/2011  ? Procedure: RADIOACTIVE SEED IMPLANT;  Surgeon: Joie Bimler, MD;  Location: [facility];  Service: Urology;  Laterality: N/A;  64 seeds implanted  ? ROTATOR CUFF REPAIR  NOV 2011  ? RIGHT SHOULDER  ? TONSILLECTOMY AND ADENOIDECTOMY  1948  ? ? ?Current Medications: ?No outpatient medications have been marked as taking for the 03/18/21 encounter (Appointment) with Park Liter, MD.  ?  ? ?Allergies:   Hydrocodone and Hydrocortisone  ? ?Social History  ? ?Socioeconomic History  ? Marital status: Married  ?  Spouse name: Not on file  ? Number of children: Not on file  ? Years of education: Not on file  ? Highest education level: Not on file  ?Occupational History  ? Not on file  ?Tobacco Use  ? Smoking status: Never  ? Smokeless tobacco: Never  ?Vaping Use  ? Vaping Use: Never used  ?Substance and Sexual Activity  ? Alcohol use: No  ? Drug use: No  ? Sexual activity: Not on file  ?Other Topics Concern  ? Not on file  ?Social History Narrative  ? Married, 3 children, truck driver  ?   ?   ?   ? ?  Social Determinants of Health  ? ?Financial Resource Strain: Not on file  ?Food Insecurity: Not on file  ?Transportation Needs: Not on file  ?Physical Activity: Not on file  ?Stress: Not on file  ?Social Connections: Not on file  ?  ? ?Family History: ?The patient's family history includes Cancer in his father and paternal grandmother. ?ROS:   ?Please see the history of present illness.    ?All 14 point review of systems negative except as described per history of present illness ? ?EKGs/Labs/Other Studies Reviewed:   ? ? ? ?Recent Labs: ?09/11/2020: ALT 24; BUN 17; Creatinine, Ser 0.88; Hemoglobin 13.6; Platelets 178; Potassium 4.8; Sodium 137  ?Recent Lipid Panel ?   ?Component Value Date/Time  ? CHOL 108 09/11/2020 1440  ? TRIG 92 09/11/2020 1440  ? HDL 38 (L)  09/11/2020 1440  ? CHOLHDL 2.8 09/11/2020 1440  ? LDLCALC 52 09/11/2020 1440  ? ? ?Physical Exam:   ? ?VS:  There were no vitals taken for this visit.   ? ?Wt Readings from Last 3 Encounters:  ?09/11/20 300 lb (136.1 kg)  ?03/05/20 299 lb (135.6 kg)  ?02/01/20 295 lb (133.8 kg)  ?  ? ?GEN:  Well nourished, well developed in no acute distress ?HEENT: Normal ?NECK: No JVD; No carotid bruits ?LYMPHATICS: No lymphadenopathy ?CARDIAC: RRR, no murmurs, no rubs, no gallops ?RESPIRATORY:  Clear to auscultation without rales, wheezing or rhonchi  ?ABDOMEN: Soft, non-tender, non-distended ?MUSCULOSKELETAL:  No edema; No deformity  ?SKIN: Warm and dry ?LOWER EXTREMITIES: no swelling ?NEUROLOGIC:  Alert and oriented x 3 ?PSYCHIATRIC:  Normal affect  ? ?ASSESSMENT:   ? ?1. Coronary artery disease involving native coronary artery of native heart without angina pectoris   ?2. Primary hypertension   ?3. OSA (obstructive sleep apnea)   ?4. Hypercholesterolemia   ? ?PLAN:   ? ?In order of problems listed above: ? ?Coronary artery disease stable from that point of view without symptoms suggesting reactivation of the problem.  He is on dual antiplatelet therapy which I decided to continue because of high risk scenario. ?Dyslipidemia I did review his K PN which only LDL 52 HDL 38 this is from 09/11/2020 he is taking Lipitor 80 which I will continue. ?Obstructive sleep apnea, still does not use CPAP mask. ?Essential hypertension: Blood pressure well controlled continue present management ? ? ?Medication Adjustments/Labs and Tests Ordered: ?Current medicines are reviewed at length with the patient today.  Concerns regarding medicines are outlined above.  ?No orders of the defined types were placed in this encounter. ? ?Medication changes: No orders of the defined types were placed in this encounter. ? ? ?Signed, ?Park Liter, MD, [facility] ?03/18/2021 2:44 PM    ?[place] ?

## 2021-03-18 NOTE — Patient Instructions (Signed)

## 2021-04-29 DIAGNOSIS — D518 Other vitamin B12 deficiency anemias: Secondary | ICD-10-CM | POA: Diagnosis not present

## 2021-04-29 DIAGNOSIS — E559 Vitamin D deficiency, unspecified: Secondary | ICD-10-CM | POA: Diagnosis not present

## 2021-04-29 DIAGNOSIS — E038 Other specified hypothyroidism: Secondary | ICD-10-CM | POA: Diagnosis not present

## 2021-04-29 DIAGNOSIS — Z79899 Other long term (current) drug therapy: Secondary | ICD-10-CM | POA: Diagnosis not present

## 2021-04-29 DIAGNOSIS — R7303 Prediabetes: Secondary | ICD-10-CM | POA: Diagnosis not present

## 2021-04-29 DIAGNOSIS — E782 Mixed hyperlipidemia: Secondary | ICD-10-CM | POA: Diagnosis not present

## 2021-04-29 DIAGNOSIS — I10 Essential (primary) hypertension: Secondary | ICD-10-CM | POA: Diagnosis not present

## 2021-04-29 DIAGNOSIS — R600 Localized edema: Secondary | ICD-10-CM | POA: Diagnosis not present

## 2021-04-29 DIAGNOSIS — Z0000 Encounter for general adult medical examination without abnormal findings: Secondary | ICD-10-CM | POA: Diagnosis not present

## 2021-04-29 DIAGNOSIS — R0602 Shortness of breath: Secondary | ICD-10-CM | POA: Diagnosis not present

## 2021-04-30 DIAGNOSIS — R011 Cardiac murmur, unspecified: Secondary | ICD-10-CM | POA: Diagnosis not present

## 2021-05-01 DIAGNOSIS — R600 Localized edema: Secondary | ICD-10-CM | POA: Diagnosis not present

## 2021-05-01 DIAGNOSIS — M79606 Pain in leg, unspecified: Secondary | ICD-10-CM | POA: Diagnosis not present

## 2021-05-02 DIAGNOSIS — N401 Enlarged prostate with lower urinary tract symptoms: Secondary | ICD-10-CM | POA: Diagnosis not present

## 2021-05-02 DIAGNOSIS — D518 Other vitamin B12 deficiency anemias: Secondary | ICD-10-CM | POA: Diagnosis not present

## 2021-05-02 DIAGNOSIS — E559 Vitamin D deficiency, unspecified: Secondary | ICD-10-CM | POA: Diagnosis not present

## 2021-05-02 DIAGNOSIS — E038 Other specified hypothyroidism: Secondary | ICD-10-CM | POA: Diagnosis not present

## 2021-05-02 DIAGNOSIS — I10 Essential (primary) hypertension: Secondary | ICD-10-CM | POA: Diagnosis not present

## 2021-05-02 DIAGNOSIS — E782 Mixed hyperlipidemia: Secondary | ICD-10-CM | POA: Diagnosis not present

## 2021-05-03 DIAGNOSIS — R0989 Other specified symptoms and signs involving the circulatory and respiratory systems: Secondary | ICD-10-CM | POA: Diagnosis not present

## 2021-05-03 DIAGNOSIS — I6523 Occlusion and stenosis of bilateral carotid arteries: Secondary | ICD-10-CM | POA: Diagnosis not present

## 2021-05-08 ENCOUNTER — Encounter: Payer: Self-pay | Admitting: Dermatology

## 2021-05-08 ENCOUNTER — Other Ambulatory Visit: Payer: Self-pay | Admitting: Cardiology

## 2021-05-08 ENCOUNTER — Ambulatory Visit: Payer: Medicare HMO | Admitting: Dermatology

## 2021-05-08 DIAGNOSIS — Z85820 Personal history of malignant melanoma of skin: Secondary | ICD-10-CM

## 2021-05-08 DIAGNOSIS — L821 Other seborrheic keratosis: Secondary | ICD-10-CM

## 2021-05-08 DIAGNOSIS — L304 Erythema intertrigo: Secondary | ICD-10-CM | POA: Diagnosis not present

## 2021-05-08 DIAGNOSIS — Z1283 Encounter for screening for malignant neoplasm of skin: Secondary | ICD-10-CM

## 2021-05-08 DIAGNOSIS — B351 Tinea unguium: Secondary | ICD-10-CM

## 2021-05-08 DIAGNOSIS — L570 Actinic keratosis: Secondary | ICD-10-CM | POA: Diagnosis not present

## 2021-05-08 DIAGNOSIS — L820 Inflamed seborrheic keratosis: Secondary | ICD-10-CM | POA: Diagnosis not present

## 2021-05-08 DIAGNOSIS — L814 Other melanin hyperpigmentation: Secondary | ICD-10-CM

## 2021-05-08 DIAGNOSIS — D229 Melanocytic nevi, unspecified: Secondary | ICD-10-CM

## 2021-05-08 DIAGNOSIS — D692 Other nonthrombocytopenic purpura: Secondary | ICD-10-CM

## 2021-05-08 DIAGNOSIS — L578 Other skin changes due to chronic exposure to nonionizing radiation: Secondary | ICD-10-CM

## 2021-05-08 DIAGNOSIS — D1800 Hemangioma unspecified site: Secondary | ICD-10-CM

## 2021-05-08 DIAGNOSIS — I872 Venous insufficiency (chronic) (peripheral): Secondary | ICD-10-CM | POA: Diagnosis not present

## 2021-05-08 DIAGNOSIS — B353 Tinea pedis: Secondary | ICD-10-CM

## 2021-05-08 MED ORDER — DESOXIMETASONE 0.05 % EX CREA
TOPICAL_CREAM | CUTANEOUS | 3 refills | Status: AC
Start: 2021-05-08 — End: ?

## 2021-05-08 MED ORDER — KETOCONAZOLE 2 % EX CREA
TOPICAL_CREAM | CUTANEOUS | 6 refills | Status: AC
Start: 2021-05-08 — End: ?

## 2021-05-08 MED ORDER — FLUCONAZOLE 200 MG PO TABS: ORAL_TABLET | ORAL | 0 refills | Status: AC

## 2021-05-08 NOTE — Patient Instructions (Signed)

## 2021-05-08 NOTE — Telephone Encounter (Signed)
Rx refill sent to pharmacy. 

## 2021-05-08 NOTE — Progress Notes (Signed)
? ?New Patient Visit ? ?Subjective  ?Anthony Perkins is a 81 y.o. male who presents for the following: Skin issues (Dry and scaly patches on the face and scalp - patient would like to discuss treatment options ). The patient presents for Total-Body Skin Exam (TBSE) for skin cancer screening and mole check.  The patient has spots, moles and lesions to be evaluated, some may be new or changing and the patient has concerns that these could be cancer. ? ?The following portions of the chart were reviewed this encounter and updated as appropriate:  ? Tobacco  Allergies  Meds  Problems  Med Hx  Surg Hx  Fam Hx   ?  ?Review of Systems:  No other skin or systemic complaints except as noted in HPI or Assessment and Plan. ? ?Objective  ?Well appearing patient in no apparent distress; mood and affect are within normal limits. ? ?A full examination was performed including scalp, head, eyes, ears, nose, lips, neck, chest, axillae, abdomen, back, buttocks, bilateral upper extremities, bilateral lower extremities, hands, feet, fingers, toes, fingernails, and toenails. All findings within normal limits unless otherwise noted below. ? ?Face x 17 (17) ?Erythematous thin papules/macules with gritty scale.  ? ?Groin ?Erythema. ? ?B/L leg ?Erythematous, scaly patches involving the ankle and distal lower leg with associated lower leg edema.  ? ? ? ? ? ? ?B/L foot ?Toenail dystrophy with scale. ? ? ? ? ? ? ?R forehead x 3 ?Erythematous stuck-on, waxy papule or plaque ? ? ?Assessment & Plan  ?AK (actinic keratosis) (17) ?Face x 17 ?Destruction of lesion - Face x 17 ?Complexity: simple   ?Destruction method: cryotherapy   ?Informed consent: discussed and consent obtained   ?Timeout:  patient name, date of birth, surgical site, and procedure verified ?Lesion destroyed using liquid nitrogen: Yes   ?Region frozen until ice ball extended beyond lesion: Yes   ?Outcome: patient tolerated procedure well with no complications    ?Post-procedure details: wound care instructions given   ? ?Erythema intertrigo ?Groin ?Intertrigo is a chronic recurrent rash that occurs in skin fold areas that may be associated with friction; heat; moisture; yeast; fungus; and bacteria.  It is exacerbated by increased movement / activity; sweating; and higher atmospheric temperature. ? ?Start Ketoconazole 2% cream to aa's QD.  ? ?ketoconazole (NIZORAL) 2 % cream - Groin ?Apply to irritation in groin area QHS. ? ?Stasis dermatitis of both legs ?B/L leg ?See photos ?Stasis in the legs causes chronic leg swelling, which may result in itchy or painful rashes, skin discoloration, skin texture changes, and sometimes ulceration.  Recommend daily graduated compression hose/stockings- easiest to put on first thing in morning, remove at bedtime.  Elevate legs as much as possible. Avoid salt/sodium rich foods. ? ?Start Topicort cream to aa's lower legs BID x 5d/wk. Topical steroids (such as triamcinolone, fluocinolone, fluocinonide, mometasone, clobetasol, halobetasol, betamethasone, hydrocortisone) can cause thinning and lightening of the skin if they are used for too long in the same area. Your physician has selected the right strength medicine for your problem and area affected on the body. Please use your medication only as directed by your physician to prevent side effects.  ? ?Recommend compression stockings daily.  ? ?desoximetasone (TOPICORT) 0.05 % cream - B/L leg ?Apply to rash on B/L lower leg BID 5d/wk. ? ?Tinea unguium ?B/L foot ?With tinea pedis - reviewed CMP from 09/2020 WNL. Chronic and persistent condition with duration or expected duration over one year. Condition is symptomatic /  bothersome to patient. Not to goal. ? ?Start Fluconazole '200mg'$  po QW x 3 mths. #12 0RF. Side effects of fluconazole (diflucan) include nausea, diarrhea, headache, dizziness, taste changes, rare risk of irritation of the liver, allergy, or decreased blood counts (which could  show up as infection or tiredness). ? ?fluconazole (DIFLUCAN) 200 MG tablet - B/L foot ?Take one tab po QW for 3 mths ? ?Inflamed seborrheic keratosis ?R forehead x 3 ?Destruction of lesion - R forehead x 3 ?Complexity: simple   ?Destruction method: cryotherapy   ?Informed consent: discussed and consent obtained   ?Timeout:  patient name, date of birth, surgical site, and procedure verified ?Lesion destroyed using liquid nitrogen: Yes   ?Region frozen until ice ball extended beyond lesion: Yes   ?Outcome: patient tolerated procedure well with no complications   ?Post-procedure details: wound care instructions given   ? ?Skin cancer screening ? ?Lentigines ?- Scattered tan macules ?- Due to sun exposure ?- Benign-appearing, observe ?- Recommend daily broad spectrum sunscreen SPF 30+ to sun-exposed areas, reapply every 2 hours as needed. ?- Call for any changes ? ?Seborrheic Keratoses ?- Stuck-on, waxy, tan-brown papules and/or plaques  ?- Benign-appearing ?- Discussed benign etiology and prognosis. ?- Observe ?- Call for any changes ? ?Melanocytic Nevi ?- Tan-brown and/or pink-flesh-colored symmetric macules and papules ?- Benign appearing on exam today ?- Observation ?- Call clinic for new or changing moles ?- Recommend daily use of broad spectrum spf 30+ sunscreen to sun-exposed areas.  ? ?Hemangiomas ?- Red papules ?- Discussed benign nature ?- Observe ?- Call for any changes ? ?Actinic Damage ?- Chronic condition, secondary to cumulative UV/sun exposure ?- diffuse scaly erythematous macules with underlying dyspigmentation ?- Recommend daily broad spectrum sunscreen SPF 30+ to sun-exposed areas, reapply every 2 hours as needed.  ?- Staying in the shade or wearing long sleeves, sun glasses (UVA+UVB protection) and wide brim hats (4-inch brim around the entire circumference of the hat) are also recommended for sun protection.  ?- Call for new or changing lesions. ? ?History of Melanoma - lentigo maligna type per  patient ?- No evidence of recurrence today ?- No lymphadenopathy ?- Recommend regular full body skin exams ?- Recommend daily broad spectrum sunscreen SPF 30+ to sun-exposed areas, reapply every 2 hours as needed.  ?- Call if any new or changing lesions are noted between office visits ? ?Purpura - Chronic; persistent and recurrent.  Treatable, but not curable. ?- Violaceous macules and patches ?- Benign ?- Related to trauma, age, sun damage and/or use of blood thinners, chronic use of topical and/or oral steroids ?- Observe ?- Can use OTC arnica containing moisturizer such as Dermend Bruise Formula if desired ?- Call for worsening or other concerns ? ?Skin cancer screening performed today. ? ?Return in about 3 months (around 08/08/2021) for AK follow up . ? ?IRudell Cobb, CMA, am acting as scribe for Sarina Ser, MD . ?Documentation: I have reviewed the above documentation for accuracy and completeness, and I agree with the above. ? ?Sarina Ser, MD ? ? ?

## 2021-05-21 ENCOUNTER — Encounter: Payer: Self-pay | Admitting: Dermatology

## 2021-06-05 DIAGNOSIS — M1711 Unilateral primary osteoarthritis, right knee: Secondary | ICD-10-CM | POA: Insufficient documentation

## 2021-06-05 DIAGNOSIS — M1712 Unilateral primary osteoarthritis, left knee: Secondary | ICD-10-CM | POA: Insufficient documentation

## 2021-06-24 DIAGNOSIS — H903 Sensorineural hearing loss, bilateral: Secondary | ICD-10-CM | POA: Insufficient documentation

## 2021-08-07 ENCOUNTER — Ambulatory Visit: Payer: Medicare HMO | Admitting: Dermatology

## 2021-08-07 DIAGNOSIS — L820 Inflamed seborrheic keratosis: Secondary | ICD-10-CM | POA: Diagnosis not present

## 2021-08-07 DIAGNOSIS — L570 Actinic keratosis: Secondary | ICD-10-CM | POA: Diagnosis not present

## 2021-08-07 DIAGNOSIS — L578 Other skin changes due to chronic exposure to nonionizing radiation: Secondary | ICD-10-CM | POA: Diagnosis not present

## 2021-08-07 DIAGNOSIS — D692 Other nonthrombocytopenic purpura: Secondary | ICD-10-CM

## 2021-08-07 DIAGNOSIS — Z79899 Other long term (current) drug therapy: Secondary | ICD-10-CM

## 2021-08-07 DIAGNOSIS — B353 Tinea pedis: Secondary | ICD-10-CM

## 2021-08-07 NOTE — Progress Notes (Unsigned)
   Follow-Up Visit   Subjective  Anthony Perkins is a 81 y.o. male who presents for the following: Actinic Keratosis (3 month follow up - face treated with LN2) and Follow-up (Tinea unguium follow up - Fluconazole 200 mg 1 po once a week - has taken for 2 1/2 months sor far, has 2 weeks left).  The following portions of the chart were reviewed this encounter and updated as appropriate:   Tobacco  Allergies  Meds  Problems  Med Hx  Surg Hx  Fam Hx     Review of Systems:  No other skin or systemic complaints except as noted in HPI or Assessment and Plan.  Objective  Well appearing patient in no apparent distress; mood and affect are within normal limits.  A focused examination was performed including Scalp, face,ears, arms. Relevant physical exam findings are noted in the Assessment and Plan.  Bilateral feet   Face/scalp (17) Erythematous stuck-on, waxy papule or plaque  Face (7) Erythematous thin papules/macules with gritty scale.    Assessment & Plan   Purpura - Chronic; persistent and recurrent.  Treatable, but not curable. - Violaceous macules and patches - Benign - Related to trauma, age, sun damage and/or use of blood thinners, chronic use of topical and/or oral steroids - Observe - Can use OTC arnica containing moisturizer such as Dermend Bruise Formula if desired - Call for worsening or other concerns  Tinea pedis of both feet Bilateral feet Chronic and persistent condition with duration or expected duration over one year. Condition is symptomatic / bothersome to patient. Not to goal. Continue Fluconazole 200 mg 1 po qweek until finished. Will plan to recheck on follow up  Inflamed seborrheic keratosis (17) Face/scalp Destruction of lesion - Face/scalp Complexity: simple   Destruction method: cryotherapy   Informed consent: discussed and consent obtained   Timeout:  patient name, date of birth, surgical site, and procedure verified Lesion destroyed using  liquid nitrogen: Yes   Region frozen until ice ball extended beyond lesion: Yes   Outcome: patient tolerated procedure well with no complications   Post-procedure details: wound care instructions given    AK (actinic keratosis) (7) Face Destruction of lesion - Face Complexity: simple   Destruction method: cryotherapy   Informed consent: discussed and consent obtained   Timeout:  patient name, date of birth, surgical site, and procedure verified Lesion destroyed using liquid nitrogen: Yes   Region frozen until ice ball extended beyond lesion: Yes   Outcome: patient tolerated procedure well with no complications   Post-procedure details: wound care instructions given    Actinic Damage - chronic, secondary to cumulative UV radiation exposure/sun exposure over time - diffuse scaly erythematous macules with underlying dyspigmentation - Recommend daily broad spectrum sunscreen SPF 30+ to sun-exposed areas, reapply every 2 hours as needed.  - Recommend staying in the shade or wearing long sleeves, sun glasses (UVA+UVB protection) and wide brim hats (4-inch brim around the entire circumference of the hat). - Call for new or changing lesions.  Return in about 3 months (around 11/07/2021).  I, Ashok Cordia, CMA, am acting as scribe for Sarina Ser, MD . Documentation: I have reviewed the above documentation for accuracy and completeness, and I agree with the above.  Sarina Ser, MD

## 2021-08-07 NOTE — Patient Instructions (Addendum)
Recommend Dermend lotion for arms      Cryotherapy Aftercare  Wash gently with soap and water everyday.   Apply Vaseline and Band-Aid daily until healed.     Due to recent changes in healthcare laws, you may see results of your pathology and/or laboratory studies on MyChart before the doctors have had a chance to review them. We understand that in some cases there may be results that are confusing or concerning to you. Please understand that not all results are received at the same time and often the doctors may need to interpret multiple results in order to provide you with the best plan of care or course of treatment. Therefore, we ask that you please give [place] 2 business days to thoroughly review all your results before contacting the office for clarification. Should we see a critical lab result, you will be contacted sooner.   If You Need Anything After Your Visit  If you have any questions or concerns for your doctor, please call our main line at [phone] and press option 4 to reach your doctor's medical assistant. If no one answers, please leave a voicemail as directed and we will return your call as soon as possible. Messages left after 4 pm will be answered the following business day.   You may also send [place] a message via [place]. We typically respond to MyChart messages within 1-2 business days.  For prescription refills, please ask your pharmacy to contact our office. Our fax number is [phone].  If you have an urgent issue when the clinic is closed that cannot wait until the next business day, you can page your doctor at the number below.    Please note that while we do our best to be available for urgent issues outside of office hours, we are not available 24/7.   If you have an urgent issue and are unable to reach [place], you may choose to seek medical care at your doctor's office, retail clinic, urgent care center, or emergency room.  If you have a medical emergency,  please immediately call 911 or go to the emergency department.  Pager Numbers  - Dr. Nehemiah Massed: [phone]  - Dr. Laurence Ferrari: [phone]  - Dr. Nicole Kindred: [phone]  In the event of inclement weather, please call our main line at [phone] for an update on the status of any delays or closures.  Dermatology Medication Tips: Please keep the boxes that topical medications come in in order to help keep track of the instructions about where and how to use these. Pharmacies typically print the medication instructions only on the boxes and not directly on the medication tubes.   If your medication is too expensive, please contact our office at [phone] option 4 or send [place] a message through [place].   We are unable to tell what your co-pay for medications will be in advance as this is different depending on your insurance coverage. However, we may be able to find a substitute medication at lower cost or fill out paperwork to get insurance to cover a needed medication.   If a prior authorization is required to get your medication covered by your insurance company, please allow [place] 1-2 business days to complete this process.  Drug prices often vary depending on where the prescription is filled and some pharmacies may offer cheaper prices.  The website www.goodrx.com contains coupons for medications through different pharmacies. The prices here do not account for what the cost may be with help from insurance (it  may be cheaper with your insurance), but the website can give you the price if you did not use any insurance.  - You can print the associated coupon and take it with your prescription to the pharmacy.  - You may also stop by our office during regular business hours and pick up a GoodRx coupon card.  - If you need your prescription sent electronically to a different pharmacy, notify our office through [facility] or by phone at [phone] option 4.     Si Usted Necesita Algo  Despus de Su Visita  Tambin puede enviarnos un mensaje a travs de Pharmacist, community. Por lo general respondemos a los mensajes de MyChart en el transcurso de 1 a 2 das hbiles.  Para renovar recetas, por favor pida a su farmacia que se ponga en contacto con nuestra oficina. Harland Dingwall de fax es [place] [phone].  Si tiene un asunto urgente cuando la clnica est cerrada y que no puede esperar hasta el siguiente da hbil, puede llamar/localizar a su doctor(a) al nmero que aparece a continuacin.   Por favor, tenga en cuenta que aunque hacemos todo lo posible para estar disponibles para asuntos urgentes fuera del horario de [place], no estamos disponibles las 24 horas del da, los 7 das de la [place].   Si tiene un problema urgente y no puede comunicarse con nosotros, puede optar por buscar atencin mdica  en el consultorio de su doctor(a), en una clnica privada, en un centro de atencin urgente o en una sala de emergencias.  Si tiene Engineering geologist, por favor llame inmediatamente al 911 o vaya a la sala de emergencias.  Nmeros de bper  - Dr. Nehemiah Massed: [phone]  - Dra. Moye: [phone]  - Dra. Nicole Kindred: [phone]  En caso de inclemencias del [place], por favor llame a Johnsie Kindred principal al [phone] para una actualizacin sobre el [place] de cualquier retraso o cierre.  Consejos para la medicacin en dermatologa: Por favor, guarde las cajas en las que vienen los medicamentos de uso tpico para ayudarle a seguir las instrucciones sobre dnde y cmo usarlos. Las farmacias generalmente imprimen las instrucciones del medicamento slo en las cajas y no directamente en los tubos del [place].   Si su medicamento es muy caro, por favor, pngase en contacto con Zigmund Daniel llamando al [phone] y presione la opcin 4 o envenos un mensaje a travs de Pharmacist, community.   No podemos decirle cul ser su copago por los medicamentos por adelantado ya que esto es diferente  dependiendo de la cobertura de su seguro. Sin embargo, es posible que podamos encontrar un medicamento sustituto a Electrical engineer un formulario para que el seguro cubra el medicamento que se considera necesario.   Si se requiere una autorizacin previa para que su compaa de seguros [place] su medicamento, por favor permtanos de 1 a 2 das hbiles para completar este proceso.  Los precios de los medicamentos varan con frecuencia dependiendo del Environmental consultant de dnde se surte la receta y alguna farmacias pueden ofrecer precios ms baratos.  El sitio web www.goodrx.com tiene cupones para medicamentos de Airline pilot. Los precios aqu no tienen en cuenta lo que podra costar con la ayuda del seguro (puede ser ms barato con su seguro), pero el sitio web puede darle el precio si no utiliz Research scientist (physical sciences).  - Puede imprimir el cupn correspondiente y llevarlo con su receta a la farmacia.  - Tambin puede pasar por nuestra oficina durante el horario de atencin regular  y recoger una tarjeta de cupones de GoodRx.  - Si necesita que su receta se enve electrnicamente a una farmacia diferente, informe a nuestra oficina a travs de MyChart de [place] o por telfono llamando al [phone] y presione la opcin 4.

## 2021-08-08 ENCOUNTER — Encounter: Payer: Self-pay | Admitting: Dermatology

## 2021-08-14 ENCOUNTER — Other Ambulatory Visit: Payer: Self-pay | Admitting: Cardiology

## 2021-08-30 ENCOUNTER — Other Ambulatory Visit: Payer: Self-pay | Admitting: Dermatology

## 2021-08-30 ENCOUNTER — Other Ambulatory Visit: Payer: Self-pay | Admitting: Cardiology

## 2021-08-30 DIAGNOSIS — B351 Tinea unguium: Secondary | ICD-10-CM

## 2021-09-05 ENCOUNTER — Ambulatory Visit: Payer: Medicare HMO | Admitting: Dermatology

## 2021-09-05 DIAGNOSIS — L03116 Cellulitis of left lower limb: Secondary | ICD-10-CM | POA: Diagnosis not present

## 2021-09-05 DIAGNOSIS — L97929 Non-pressure chronic ulcer of unspecified part of left lower leg with unspecified severity: Secondary | ICD-10-CM | POA: Diagnosis not present

## 2021-09-05 DIAGNOSIS — I872 Venous insufficiency (chronic) (peripheral): Secondary | ICD-10-CM

## 2021-09-05 DIAGNOSIS — L03115 Cellulitis of right lower limb: Secondary | ICD-10-CM

## 2021-09-05 DIAGNOSIS — L97919 Non-pressure chronic ulcer of unspecified part of right lower leg with unspecified severity: Secondary | ICD-10-CM

## 2021-09-05 DIAGNOSIS — L03119 Cellulitis of unspecified part of limb: Secondary | ICD-10-CM

## 2021-09-05 MED ORDER — DOXYCYCLINE HYCLATE 100 MG PO TABS: 100.0000 mg | ORAL_TABLET | Freq: Two times a day (BID) | ORAL | 0 refills | Status: AC

## 2021-09-05 NOTE — Patient Instructions (Signed)
Due to recent changes in healthcare laws, you may see results of your pathology and/or laboratory studies on MyChart before the doctors have had a chance to review them. We understand that in some cases there may be results that are confusing or concerning to you. Please understand that not all results are received at the same time and often the doctors may need to interpret multiple results in order to provide you with the best plan of care or course of treatment. Therefore, we ask that you please give us 2 business days to thoroughly review all your results before contacting the office for clarification. Should we see a critical lab result, you will be contacted sooner.   If You Need Anything After Your Visit  If you have any questions or concerns for your doctor, please call our main line at [phone] and press option 4 to reach your doctor's medical assistant. If no one answers, please leave a voicemail as directed and we will return your call as soon as possible. Messages left after 4 pm will be answered the following business day.   You may also send us a message via MyChart. We typically respond to MyChart messages within 1-2 business days.  For prescription refills, please ask your pharmacy to contact our office. Our fax number is [phone].  If you have an urgent issue when the clinic is closed that cannot wait until the next business day, you can page your doctor at the number below.    Please note that while we do our best to be available for urgent issues outside of office hours, we are not available 24/7.   If you have an urgent issue and are unable to reach us, you may choose to seek medical care at your doctor's office, retail clinic, urgent care center, or emergency room.  If you have a medical emergency, please immediately call 911 or go to the emergency department.  Pager Numbers  - Dr. Kowalski: [phone]  - Dr. Moye: [phone]  - Dr. Stewart:  [phone]  In the event of inclement weather, please call our main line at [phone] for an update on the status of any delays or closures.  Dermatology Medication Tips: Please keep the boxes that topical medications come in in order to help keep track of the instructions about where and how to use these. Pharmacies typically print the medication instructions only on the boxes and not directly on the medication tubes.   If your medication is too expensive, please contact our office at [phone] option 4 or send us a message through MyChart.   We are unable to tell what your co-pay for medications will be in advance as this is different depending on your insurance coverage. However, we may be able to find a substitute medication at lower cost or fill out paperwork to get insurance to cover a needed medication.   If a prior authorization is required to get your medication covered by your insurance company, please allow us 1-2 business days to complete this process.  Drug prices often vary depending on where the prescription is filled and some pharmacies may offer cheaper prices.  The website www.goodrx.com contains coupons for medications through different pharmacies. The prices here do not account for what the cost may be with help from insurance (it may be cheaper with your insurance), but the website can give you the price if you did not use any insurance.  - You can print the associated coupon and take it with   your prescription to the pharmacy.  - You may also stop by our office during regular business hours and pick up a GoodRx coupon card.  - If you need your prescription sent electronically to a different pharmacy, notify our office through [place] MyChart or by phone at [phone] option 4.     Si Usted Necesita Algo Despus de Su Visita  Tambin puede enviarnos un mensaje a travs de MyChart. Por lo general respondemos a los mensajes de MyChart en el transcurso de 1 a 2  das hbiles.  Para renovar recetas, por favor pida a su farmacia que se ponga en contacto con nuestra oficina. Nuestro nmero de fax es el [phone].  Si tiene un asunto urgente cuando la clnica est cerrada y que no puede esperar hasta el siguiente da hbil, puede llamar/localizar a su doctor(a) al nmero que aparece a continuacin.   Por favor, tenga en cuenta que aunque hacemos todo lo posible para estar disponibles para asuntos urgentes fuera del horario de oficina, no estamos disponibles las 24 horas del da, los 7 das de la semana.   Si tiene un problema urgente y no puede comunicarse con nosotros, puede optar por buscar atencin mdica  en el consultorio de su doctor(a), en una clnica privada, en un centro de atencin urgente o en una sala de emergencias.  Si tiene una emergencia mdica, por favor llame inmediatamente al 911 o vaya a la sala de emergencias.  Nmeros de bper  - Dr. Kowalski: [phone]  - Dra. Moye: [phone]  - Dra. Stewart: [phone]  En caso de inclemencias del tiempo, por favor llame a nuestra lnea principal al [phone] para una actualizacin sobre el estado de cualquier retraso o cierre.  Consejos para la medicacin en dermatologa: Por favor, guarde las cajas en las que vienen los medicamentos de uso tpico para ayudarle a seguir las instrucciones sobre dnde y cmo usarlos. Las farmacias generalmente imprimen las instrucciones del medicamento slo en las cajas y no directamente en los tubos del medicamento.   Si su medicamento es muy caro, por favor, pngase en contacto con nuestra oficina llamando al [phone] y presione la opcin 4 o envenos un mensaje a travs de MyChart.   No podemos decirle cul ser su copago por los medicamentos por adelantado ya que esto es diferente dependiendo de la cobertura de su seguro. Sin embargo, es posible que podamos encontrar un medicamento sustituto a menor costo o llenar un formulario para que el  seguro cubra el medicamento que se considera necesario.   Si se requiere una autorizacin previa para que su compaa de seguros cubra su medicamento, por favor permtanos de 1 a 2 das hbiles para completar este proceso.  Los precios de los medicamentos varan con frecuencia dependiendo del lugar de dnde se surte la receta y alguna farmacias pueden ofrecer precios ms baratos.  El sitio web www.goodrx.com tiene cupones para medicamentos de diferentes farmacias. Los precios aqu no tienen en cuenta lo que podra costar con la ayuda del seguro (puede ser ms barato con su seguro), pero el sitio web puede darle el precio si no utiliz ningn seguro.  - Puede imprimir el cupn correspondiente y llevarlo con su receta a la farmacia.  - Tambin puede pasar por nuestra oficina durante el horario de atencin regular y recoger una tarjeta de cupones de GoodRx.  - Si necesita que su receta se enve electrnicamente a una farmacia diferente, informe a nuestra oficina a travs de MyChart de [place]   o por telfono llamando al [phone] y presione la opcin 4.  

## 2021-09-05 NOTE — Progress Notes (Signed)
   Follow-Up Visit   Subjective  Anthony Perkins is a 81 y.o. male who presents for the following: Skin Problem (Pt c/o cellulitis on the lower legs x 1 week). PCP prescribed Lasix 20 mg daily and Cephalexin 500 mg tablets x 10 days, pt report legs still swelling and whelping and painful.  Wife with patient and contributes to history.  The following portions of the chart were reviewed this encounter and updated as appropriate:   Tobacco  Allergies  Meds  Problems  Med Hx  Surg Hx  Fam Hx     Review of Systems:  No other skin or systemic complaints except as noted in HPI or Assessment and Plan.  Objective  Well appearing patient in no apparent distress; mood and affect are within normal limits.  A focused examination was performed including lower legs. Relevant physical exam findings are noted in the Assessment and Plan.  lower legs Edema and erythema, whelping areas, 5 smaller ulcers on the right leg, larger ulcer ~6 cm on the left leg              Assessment & Plan  Stasis dermatitis of both legs With ulcers and cellulitis -severe lower legs  Culture performed to right ankle and foot. Legs cleaned with Puracyn. Unna boots applied to both legs. Patient to follow up with Dr Laurence Ferrari 09/10/21 at 12 pm.  Aerobic Culture - lower legs  Remv/revisn boot/body cast - lower legs 2 layered boots with Unna boot and Coban wrap compression x2 legs  Related Medications desoximetasone (TOPICORT) 0.05 % cream Apply to rash on B/L lower leg BID 5d/wk.  Return for Follow up as scheduled.  IMarye Round, CMA, am acting as scribe for Sarina Ser, MD .  Documentation: I have reviewed the above documentation for accuracy and completeness, and I agree with the above.  Sarina Ser, MD

## 2021-09-09 LAB — AEROBIC CULTURE

## 2021-09-10 ENCOUNTER — Ambulatory Visit: Payer: Medicare HMO | Admitting: Dermatology

## 2021-09-10 DIAGNOSIS — I872 Venous insufficiency (chronic) (peripheral): Secondary | ICD-10-CM | POA: Diagnosis not present

## 2021-09-10 DIAGNOSIS — L03116 Cellulitis of left lower limb: Secondary | ICD-10-CM

## 2021-09-10 DIAGNOSIS — L03115 Cellulitis of right lower limb: Secondary | ICD-10-CM | POA: Diagnosis not present

## 2021-09-10 MED ORDER — SULFAMETHOXAZOLE-TRIMETHOPRIM 800-160 MG PO TABS: 1.0000 | ORAL_TABLET | Freq: Two times a day (BID) | ORAL | 0 refills | Status: AC

## 2021-09-10 NOTE — Patient Instructions (Addendum)
Discontinue doxycycline  Start Bactrim twice daily for 5 days. Recommend gauze roll and ace bandage to wrap legs.   Due to recent changes in healthcare laws, you may see results of your pathology and/or laboratory studies on MyChart before the doctors have had a chance to review them. We understand that in some cases there may be results that are confusing or concerning to you. Please understand that not all results are received at the same time and often the doctors may need to interpret multiple results in order to provide you with the best plan of care or course of treatment. Therefore, we ask that you please give [place] 2 business days to thoroughly review all your results before contacting the office for clarification. Should we see a critical lab result, you will be contacted sooner.   If You Need Anything After Your Visit  If you have any questions or concerns for your doctor, please call our main line at [phone] and press option 4 to reach your doctor's medical assistant. If no one answers, please leave a voicemail as directed and we will return your call as soon as possible. Messages left after 4 pm will be answered the following business day.   You may also send [place] a message via [place]. We typically respond to MyChart messages within 1-2 business days.  For prescription refills, please ask your pharmacy to contact our office. Our fax number is [phone].  If you have an urgent issue when the clinic is closed that cannot wait until the next business day, you can page your doctor at the number below.    Please note that while we do our best to be available for urgent issues outside of office hours, we are not available 24/7.   If you have an urgent issue and are unable to reach [place], you may choose to seek medical care at your doctor's office, retail clinic, urgent care center, or emergency room.  If you have a medical emergency, please immediately call 911 or go to the emergency  department.  Pager Numbers  - Dr. Nehemiah Massed: [phone]  - Dr. Laurence Ferrari: [phone]  - Dr. Nicole Kindred: [phone]  In the event of inclement weather, please call our main line at [phone] for an update on the status of any delays or closures.  Dermatology Medication Tips: Please keep the boxes that topical medications come in in order to help keep track of the instructions about where and how to use these. Pharmacies typically print the medication instructions only on the boxes and not directly on the medication tubes.   If your medication is too expensive, please contact our office at [phone] option 4 or send [place] a message through [place].   We are unable to tell what your co-pay for medications will be in advance as this is different depending on your insurance coverage. However, we may be able to find a substitute medication at lower cost or fill out paperwork to get insurance to cover a needed medication.   If a prior authorization is required to get your medication covered by your insurance company, please allow [place] 1-2 business days to complete this process.  Drug prices often vary depending on where the prescription is filled and some pharmacies may offer cheaper prices.  The website www.goodrx.com contains coupons for medications through different pharmacies. The prices here do not account for what the cost may be with help from insurance (it may be cheaper with your insurance), but the website can give you  give you the price if you did not use any insurance.  - You can print the associated coupon and take it with your prescription to the pharmacy.  - You may also stop by our office during regular business hours and pick up a GoodRx coupon card.  - If you need your prescription sent electronically to a different pharmacy, notify our office through [place] MyChart or by phone at [phone] option 4.     Si Usted Necesita Algo Despus de Su Visita  Tambin puede  enviarnos un mensaje a travs de MyChart. Por lo general respondemos a los mensajes de MyChart en el transcurso de 1 a 2 das hbiles.  Para renovar recetas, por favor pida a su farmacia que se ponga en contacto con nuestra oficina. Nuestro nmero de fax es el [phone].  Si tiene un asunto urgente cuando la clnica est cerrada y que no puede esperar hasta el siguiente da hbil, puede llamar/localizar a su doctor(a) al nmero que aparece a continuacin.   Por favor, tenga en cuenta que aunque hacemos todo lo posible para estar disponibles para asuntos urgentes fuera del horario de oficina, no estamos disponibles las 24 horas del da, los 7 das de la semana.   Si tiene un problema urgente y no puede comunicarse con nosotros, puede optar por buscar atencin mdica  en el consultorio de su doctor(a), en una clnica privada, en un centro de atencin urgente o en una sala de emergencias.  Si tiene una emergencia mdica, por favor llame inmediatamente al 911 o vaya a la sala de emergencias.  Nmeros de bper  - Dr. Kowalski: [phone]  - Dra. Moye: [phone]  - Dra. Stewart: [phone]  En caso de inclemencias del tiempo, por favor llame a nuestra lnea principal al [phone] para una actualizacin sobre el estado de cualquier retraso o cierre.  Consejos para la medicacin en dermatologa: Por favor, guarde las cajas en las que vienen los medicamentos de uso tpico para ayudarle a seguir las instrucciones sobre dnde y cmo usarlos. Las farmacias generalmente imprimen las instrucciones del medicamento slo en las cajas y no directamente en los tubos del medicamento.   Si su medicamento es muy caro, por favor, pngase en contacto con nuestra oficina llamando al [phone] y presione la opcin 4 o envenos un mensaje a travs de MyChart.   No podemos decirle cul ser su copago por los medicamentos por adelantado ya que esto es diferente dependiendo de la cobertura de su seguro.  Sin embargo, es posible que podamos encontrar un medicamento sustituto a menor costo o llenar un formulario para que el seguro cubra el medicamento que se considera necesario.   Si se requiere una autorizacin previa para que su compaa de seguros cubra su medicamento, por favor permtanos de 1 a 2 das hbiles para completar este proceso.  Los precios de los medicamentos varan con frecuencia dependiendo del lugar de dnde se surte la receta y alguna farmacias pueden ofrecer precios ms baratos.  El sitio web www.goodrx.com tiene cupones para medicamentos de diferentes farmacias. Los precios aqu no tienen en cuenta lo que podra costar con la ayuda del seguro (puede ser ms barato con su seguro), pero el sitio web puede darle el precio si no utiliz ningn seguro.  - Puede imprimir el cupn correspondiente y llevarlo con su receta a la farmacia.  - Tambin puede pasar por nuestra oficina durante el horario de atencin regular y recoger una tarjeta de cupones de GoodRx.  -   que su receta se enve electrnicamente a Chiropodist, informe a nuestra oficina a travs de MyChart de [place] o por telfono llamando al [phone] y presione la opcin 4.

## 2021-09-10 NOTE — Progress Notes (Signed)
   Follow-Up Visit   Subjective  Anthony Perkins is a 81 y.o. male who presents for the following: Follow-up (Patient was here last Thursday and saw Dr. Nehemiah Massed. He had double unna boots applied but patient advises he had to take them off at home because it was very painful. ).  Patient finished cephalexin prescribed by PCP yesterday, started desoximetasone and doxycycline today.   Patient accompanied by wife who contributes to history.   The following portions of the chart were reviewed this encounter and updated as appropriate:   Tobacco  Allergies  Meds  Problems  Med Hx  Surg Hx  Fam Hx      Review of Systems:  No other skin or systemic complaints except as noted in HPI or Assessment and Plan.  Objective  Well appearing patient in no apparent distress; mood and affect are within normal limits.  A focused examination was performed including legs. Relevant physical exam findings are noted in the Assessment and Plan.    Assessment & Plan  Cellulitis of left leg b/l lower legs  D/c doxycycline Start Bactrim 800-160 twice daily x 5 days   sulfamethoxazole-trimethoprim (BACTRIM DS) 800-160 MG tablet - b/l lower legs Take 1 tablet by mouth 2 (two) times daily for 5 days.  Stasis dermatitis of both legs Left Lower Leg - Anterior  Apply desoximetasone twice a day to affected areas Keep legs elevated Defer Unna boot until recheck given concern for infection  Related Medications desoximetasone (TOPICORT) 0.05 % cream Apply to rash on B/L lower leg BID 5d/wk.   Return in about 2 days (around 09/12/2021).  Graciella Belton, RMA, am acting as scribe for Forest Gleason, MD .  Documentation: I have reviewed the above documentation for accuracy and completeness, and I agree with the above.  Forest Gleason, MD

## 2021-09-11 DIAGNOSIS — I351 Nonrheumatic aortic (valve) insufficiency: Secondary | ICD-10-CM

## 2021-09-11 DIAGNOSIS — I340 Nonrheumatic mitral (valve) insufficiency: Secondary | ICD-10-CM

## 2021-09-12 ENCOUNTER — Ambulatory Visit: Payer: Medicare HMO | Admitting: Dermatology

## 2021-09-13 ENCOUNTER — Encounter: Payer: Self-pay | Admitting: Dermatology

## 2021-09-17 ENCOUNTER — Telehealth: Payer: Self-pay

## 2021-09-17 NOTE — Telephone Encounter (Signed)
-----   Message from Ralene Bathe, MD sent at 09/12/2021 10:24 AM EDT ----- Culture from skin of legs from 09/05/2021 showed: Heavy growth Serratia marcescens Resistant to TCN Dr Laurence Ferrari has already ordered D/C Doxycycline and Start TMP/SMX (organism sensitive to this). See note. Keep follow up appt

## 2021-09-17 NOTE — Telephone Encounter (Signed)
Left message on voicemail to return my call.  

## 2021-09-22 ENCOUNTER — Encounter: Payer: Self-pay | Admitting: Dermatology

## 2021-09-23 ENCOUNTER — Ambulatory Visit: Payer: Medicare HMO | Admitting: Cardiology

## 2021-10-03 ENCOUNTER — Ambulatory Visit: Payer: Medicare HMO | Attending: Cardiology | Admitting: Cardiology

## 2021-10-03 ENCOUNTER — Encounter: Payer: Self-pay | Admitting: Cardiology

## 2021-10-03 VITALS — BP 136/76 | HR 78 | Ht 70.0 in | Wt 276.2 lb

## 2021-10-03 DIAGNOSIS — I2510 Atherosclerotic heart disease of native coronary artery without angina pectoris: Secondary | ICD-10-CM

## 2021-10-03 DIAGNOSIS — Z872 Personal history of diseases of the skin and subcutaneous tissue: Secondary | ICD-10-CM

## 2021-10-03 DIAGNOSIS — G4733 Obstructive sleep apnea (adult) (pediatric): Secondary | ICD-10-CM

## 2021-10-03 DIAGNOSIS — I5032 Chronic diastolic (congestive) heart failure: Secondary | ICD-10-CM

## 2021-10-03 DIAGNOSIS — I509 Heart failure, unspecified: Secondary | ICD-10-CM | POA: Insufficient documentation

## 2021-10-03 DIAGNOSIS — I10 Essential (primary) hypertension: Secondary | ICD-10-CM

## 2021-10-03 MED ORDER — TORSEMIDE 20 MG PO TABS: 30.0000 mg | ORAL_TABLET | Freq: Every day | ORAL | 3 refills | Status: DC

## 2021-10-03 NOTE — Progress Notes (Signed)
Cardiology Office Note:    Date:  10/03/2021   ID:  Anthony Perkins, DOB 1940/04/17, MRN [id number]  PCP:  Bonnita Nasuti, MD  Cardiologist:  Jenne Campus, MD    Referring MD: Bonnita Nasuti, MD   Chief Complaint  Patient presents with   Follow up [place] fall    History of Present Illness:    Anthony Perkins is a 81 y.o. male  with past medical history significant for coronary artery disease.  He did have PTCA and stenting with drug-eluting stent of circumflex artery in 2016.  Also have history of essential hypertension, obstructive sleep apnea but he refused CPAP mask, dyslipidemia. He comes today to my office for follow-up.  Recently he end up in the hospital after he fell down.  Luckily he did not break anything.  Denies having any chest pain tightness squeezing pressure burning chest.  He does have some swelling of lower extremities which seems to be worse  Past Medical History:  Diagnosis Date   Arthritis KNEES   Coronary artery disease    Coronary artery disease involving native coronary artery of native heart without angina pectoris 08/08/2014   Overview:  PTCA and stenting to circumflex artery at the beginning of 2016   Decreased hearing of right ear 07/14/2018   History of cellulitis of skin with lymphangitis 2004   STATES WEARS TED HOSE DAILY--  NO SWELLING BUT SKIN IS RED  SINCE 2004 EPISODE   History of urinary tract obstruction SECONDARY BPH   Hypercholesterolemia 08/08/2014   Hyperlipidemia    Hypertension 02/15/2016   Melanoma ([place]) 1996   L post ear - lentigo maligna   Nocturia    Obstructive sleep apnea syndrome 08/08/2014   OSA (obstructive sleep apnea)    Prostate cancer ([place]) 12/20/2010   gleason 6, vol 42 cc   PSA elevation    Right chronic serous otitis media 08/02/2018   Vasomotor rhinitis 08/02/2018    Past Surgical History:  Procedure Laterality Date   CARDIAC CATHETERIZATION     CYSTOSCOPY  07/30/2011   Procedure: CYSTOSCOPY FLEXIBLE;   Surgeon: Joie Bimler, MD;  Location: [facility];  Service: Urology;  Laterality: N/A;  no seeds found in bladder   [place] BIOPSY  12/20/10  DR Nila Nephew 'S OFFICE   Gleason 6, vol 42 cc   RADIOACTIVE SEED IMPLANT  07/30/2011   Procedure: RADIOACTIVE SEED IMPLANT;  Surgeon: Joie Bimler, MD;  [facility];  Service: Urology;  Laterality: N/A;  64 seeds implanted   ROTATOR CUFF REPAIR  NOV 2011   RIGHT SHOULDER   TONSILLECTOMY AND ADENOIDECTOMY  1948    Current Medications: Current Meds  Medication Sig   aspirin EC 81 MG tablet Take 81 mg by mouth daily.   atorvastatin (LIPITOR) 80 MG tablet TAKE 1 TABLET BY MOUTH EVERY DAY (Patient taking differently: Take 80 mg by mouth daily.)   clopidogrel (PLAVIX) 75 MG tablet TAKE 1 TABLET BY MOUTH EVERY DAY (Patient taking differently: Take 75 mg by mouth daily.)   clotrimazole-betamethasone (LOTRISONE) cream Apply 1 application topically daily.   desoximetasone (TOPICORT) 0.05 % cream Apply to rash on B/L lower leg BID 5d/wk. (Patient taking differently: Apply 1 Application topically 2 (two) times daily. Apply to rash on B/L lower leg BID 5d/wk.)   finasteride (PROSCAR) 5 MG tablet Take 5 mg by mouth daily.   fluconazole (DIFLUCAN) 200 MG tablet  Take one tab po QW for 3 mths (Patient taking differently: Take 200 mg by mouth once a week. Take one tab po QW for 3 mths)   fluorouracil (EFUDEX) 5 % cream Apply 1 application topically in the morning and at bedtime.   ketoconazole (NIZORAL) 2 % cream Apply to irritation in groin area QHS. (Patient taking differently: Apply 1 Application topically at bedtime. Apply to irritation in groin area QHS.)   lisinopril (ZESTRIL) 2.5 MG tablet Take 1 tablet (2.5 mg total) by mouth daily.   nitroGLYCERIN (NITROSTAT) 0.4 MG SL tablet Place 1 tablet (0.4 mg total) under the tongue as needed for chest pain.   ranolazine (RANEXA) 500 MG 12 hr tablet  Take 1 tablet (500 mg total) by mouth 2 (two) times daily.   tamsulosin (FLOMAX) 0.4 MG CAPS capsule Take 0.4 mg by mouth daily.   [DISCONTINUED] torsemide (DEMADEX) 20 MG tablet Take 30 mg by mouth daily.   [DISCONTINUED] torsemide (DEMADEX) 20 MG tablet Take 1.5 tablets (30 mg total) by mouth daily.     Allergies:   Hydrocodone   Social History   Socioeconomic History   Marital status: Married    Spouse name: Not on file   Number of children: Not on file   Years of education: Not on file   Highest education level: Not on file  Occupational History   Not on file  Tobacco Use   Smoking status: Never   Smokeless tobacco: Never  Vaping Use   Vaping Use: Never used  Substance and Sexual Activity   Alcohol use: No   Drug use: No   Sexual activity: Not on file  Other Topics Concern   Not on file  Social History Narrative   Married, 3 children, truck Geophysicist/field seismologist            Social Determinants of Health   Financial Resource Strain: Not on file  Food Insecurity: Not on file  Transportation Needs: Not on file  Physical Activity: Not on file  Stress: Not on file  Social Connections: Not on file     Family History: The patient's family history includes Cancer in his father and paternal grandmother. ROS:   Please see the history of present illness.    All 14 point review of systems negative except as described per history of present illness  EKGs/Labs/Other Studies Reviewed:      Recent Labs: No results found for requested labs within last 365 days.  Recent Lipid Panel    Component Value Date/Time   CHOL 108 09/11/2020 1440   TRIG 92 09/11/2020 1440   HDL 38 (L) 09/11/2020 1440   CHOLHDL 2.8 09/11/2020 1440   LDLCALC 52 09/11/2020 1440    Physical Exam:    VS:  BP 136/76 (BP Location: Left Arm, Patient Position: Sitting)   Pulse 78   Ht '5\' 10"'$  (1.778 m)   Wt 276 lb 3.2 oz (125.3 kg)   SpO2 94%   BMI 39.63 kg/m     Wt Readings from Last 3 Encounters:   10/03/21 276 lb 3.2 oz (125.3 kg)  03/18/21 280 lb 9.6 oz (127.3 kg)  09/11/20 300 lb (136.1 kg)     GEN:  Well nourished, well developed in no acute distress HEENT: Normal NECK: No JVD; No carotid bruits LYMPHATICS: No lymphadenopathy CARDIAC: RRR, no murmurs, no rubs, no gallops RESPIRATORY:  Clear to auscultation without rales, wheezing or rhonchi  ABDOMEN: Soft, non-tender, non-distended MUSCULOSKELETAL:  No edema; No deformity  SKIN: Warm and dry LOWER EXTREMITIES: 1+ swelling NEUROLOGIC:  Alert and oriented x 3 PSYCHIATRIC:  Normal affect   ASSESSMENT:    1. Primary hypertension   2. Coronary artery disease involving native coronary artery of native heart without angina pectoris   3. Obstructive sleep apnea syndrome   4. Chronic diastolic congestive heart failure ([place])   5. History of cellulitis of skin with lymphangitis    PLAN:    In order of problems listed above:  Coronary disease stable from that point review continue present management. Obstructive sleep apnea: Refused to wear CPAP mask. Chronic diastolic congestive heart failure, will increase dose of torsemide to 30 mg daily we will check Chem-7 next week see him back 6 weeks I did review record from hospital for this visit   Medication Adjustments/Labs and Tests Ordered: Current medicines are reviewed at length with the patient today.  Concerns regarding medicines are outlined above.  Orders Placed This Encounter  Procedures   Basic metabolic panel   Medication changes:  Meds ordered this encounter  Medications   DISCONTD: torsemide (DEMADEX) 20 MG tablet    Sig: Take 1.5 tablets (30 mg total) by mouth daily.    Dispense:  145 tablet    Refill:  3   torsemide (DEMADEX) 20 MG tablet    Sig: Take 1.5 tablets (30 mg total) by mouth daily.    Dispense:  145 tablet    Refill:  3    Signed, Park Liter, MD, [facility] 10/03/2021 5:16 PM    [facility]

## 2021-10-03 NOTE — Patient Instructions (Addendum)
Medication Instructions:  Your physician has recommended you make the following change in your medication:   INCREASE: Torsemide to '30mg'$   daily. Take 1 1/2 tablet of '20mg'$  daily   Lab Work: Your physician recommends that you return for lab work in: 1 week You need to have labs done when you are fasting.  You can come Monday through Friday 8:30 am to 12:00 pm and 1:15 to 4:30. You do not need to make an appointment as the order has already been placed. The labs you are going to have done are BMET.    Testing/Procedures: None Ordered   Follow-Up: At [facility], you and your health needs are our priority.  As part of our continuing mission to provide you with exceptional heart care, we have created designated Provider Care Teams.  These Care Teams include your primary Cardiologist (physician) and Advanced Practice Providers (APPs -  Physician Assistants and Nurse Practitioners) who all work together to provide you with the care you need, when you need it.  We recommend signing up for the patient portal called "MyChart".  Sign up information is provided on this After Visit Summary.  MyChart is used to connect with patients for Virtual Visits (Telemedicine).  Patients are able to view lab/test results, encounter notes, upcoming appointments, etc.  Non-urgent messages can be sent to your provider as well.   To learn more about what you can do with MyChart, go to [email or web address].    Your next appointment:   6 week(s)  The format for your next appointment:   In Person  Provider:   Jenne Campus, MD    Other Instructions NA

## 2021-10-12 LAB — BASIC METABOLIC PANEL
BUN/Creatinine Ratio: 26 — ABNORMAL HIGH (ref 10–24)
BUN: 28 mg/dL — ABNORMAL HIGH (ref 8–27)
CO2: 23 mmol/L (ref 20–29)
Calcium: 8.9 mg/dL (ref 8.6–10.2)
Chloride: 101 mmol/L (ref 96–106)
Creatinine, Ser: 1.07 mg/dL (ref 0.76–1.27)
Glucose: 96 mg/dL (ref 70–99)
Potassium: 4.8 mmol/L (ref 3.5–5.2)
Sodium: 140 mmol/L (ref 134–144)
eGFR: 70 mL/min/{1.73_m2} (ref >59)

## 2021-10-15 ENCOUNTER — Telehealth: Payer: Self-pay

## 2021-10-15 NOTE — Telephone Encounter (Signed)
-----   Message from Ralene Bathe, MD sent at 09/12/2021 10:24 AM EDT ----- Culture from skin of legs from 09/05/2021 showed: Heavy growth Serratia marcescens Resistant to TCN Dr Laurence Ferrari has already ordered D/C Doxycycline and Start TMP/SMX (organism sensitive to this). See note. Keep follow up appt

## 2021-10-15 NOTE — Telephone Encounter (Signed)
Patient informed of culture results and states that legs have almost completely healed. Advised patient to keep follow up appointment.

## 2021-10-28 ENCOUNTER — Telehealth: Payer: Self-pay | Admitting: Cardiology

## 2021-10-28 NOTE — Telephone Encounter (Signed)
Left a message for the [facility] nurse/ Stacy to call back.

## 2021-10-28 NOTE — Telephone Encounter (Signed)
Pt c/o medication issue:  1. Name of Medication: torsemide (DEMADEX) 20 MG tablet  2. How are you currently taking this medication (dosage and times per day)?    Take 1.5 tablets (30 mg total) by mouth daily.      3. Are you having a reaction (difficulty breathing--STAT)? no  4. What is your medication issue?  Called to say that the patient didn't take his medication torsemide (DEMADEX) 20 MG tabletover the weekend. Patient has swelling and irregular heart rate. Please advise

## 2021-10-30 NOTE — Telephone Encounter (Signed)
LVM for [place] to return call

## 2021-10-30 NOTE — Telephone Encounter (Signed)
Follow Up:   Anthony Perkins is returning Ann's call from 10-28-21

## 2021-11-01 NOTE — Telephone Encounter (Signed)
LVM for [facility] nurse

## 2021-11-01 NOTE — Telephone Encounter (Signed)
Stacy from [facility] home [facility] stated that the pt stopped his Torsemide. He didn't like it because it made him pee and made him feel bad. He has some increased swelling and his weight is stable. She said his HR was 40 when she arrived and 72 when she left. She stated that he told her he would start his Torsemide back and they will see him on Monday to reassess. Dr. Agustin Cree aware.

## 2021-11-01 NOTE — Telephone Encounter (Signed)
Marzetta Board is returning call again for patient.

## 2021-11-04 ENCOUNTER — Telehealth: Payer: Self-pay | Admitting: Cardiology

## 2021-11-04 ENCOUNTER — Telehealth: Payer: Self-pay

## 2021-11-04 NOTE — Telephone Encounter (Signed)
Ronalee Belts with [facility] called to report Mr. Hallisey' HR was 48-50 and skipping about every 3rd beat. He was not having any symptoms, weight stable.

## 2021-11-06 ENCOUNTER — Telehealth: Payer: Self-pay | Admitting: Cardiology

## 2021-11-06 NOTE — Telephone Encounter (Signed)
STAT if HR is under 50 or over 120 (normal HR is 60-100 beats per minute)  What is your heart rate? 44-46  Do you have a log of your heart rate readings (document readings)? Reports HR was normal for home health RN 10/31  Do you have any other symptoms? No    Mary a PT with [facility] is calling to report the patient's HR was between 44-46 at the time of her visit today, so she did not go through with therapy. Patient is asymptomatic, and did some walking to the bathroom, but HR did not elevate. She also states patient is only taking 1 tablet of torsemide instead of 1.5 as prescribed and has been doing this for about two weeks. She states the patient has also had some weight gain since the last time he was seen by our office that is starting to come down maybe causing the bradycardia. She was no longer with pt at time of call to transfer to triage. Patient has been added to the wait list incase of a sooner appt coming available per her request. Please advise.

## 2021-11-08 ENCOUNTER — Telehealth: Payer: Self-pay

## 2021-11-08 DIAGNOSIS — I2510 Atherosclerotic heart disease of native coronary artery without angina pectoris: Secondary | ICD-10-CM

## 2021-11-08 NOTE — Telephone Encounter (Signed)
LVM for [facility] that we had tried to call pt for a 7 day Zio patch. She said in her note that she would see pt on Monday. Advised that she could relay the message on Monday- as we had tried 2 times to call the pt.

## 2021-11-08 NOTE — Telephone Encounter (Signed)
LVM for [facility] to return call

## 2021-11-08 NOTE — Telephone Encounter (Signed)
Called pt to advise per Dr. Wendy Poet note to come for a ZIO for 1 week. VM full

## 2021-11-08 NOTE — Telephone Encounter (Signed)
STAT if HR is under 50 or over 120 (normal HR is 60-100 beats per minute)  What is your heart rate?   Do you have a log of your heart rate readings (document readings)?  Stacy of [facility] is following up. Marzetta Board saw the patient today and would like to report HR for today, ranging 40-58. She is no longer with the patient, but states she will be with him again on Monday.   Do you have any other symptoms?  No

## 2021-11-08 NOTE — Telephone Encounter (Signed)
Zio x 7 days per Dr. Agustin Cree

## 2021-11-08 NOTE — Telephone Encounter (Signed)
Called pt to advise per Dr. Wendy Poet note to come for a ZIO for 1 week. VM full.

## 2021-11-14 ENCOUNTER — Ambulatory Visit: Payer: Medicare HMO | Admitting: Dermatology

## 2021-11-14 DIAGNOSIS — L578 Other skin changes due to chronic exposure to nonionizing radiation: Secondary | ICD-10-CM

## 2021-11-14 DIAGNOSIS — I872 Venous insufficiency (chronic) (peripheral): Secondary | ICD-10-CM

## 2021-11-14 DIAGNOSIS — L570 Actinic keratosis: Secondary | ICD-10-CM | POA: Diagnosis not present

## 2021-11-14 DIAGNOSIS — L820 Inflamed seborrheic keratosis: Secondary | ICD-10-CM

## 2021-11-14 NOTE — Progress Notes (Signed)
   Follow-Up Visit   Subjective  Anthony Perkins is a 81 y.o. male who presents for the following: Follow-up (Stasis derm follow up - bilateral legs - Home Health wrapped his legs yesterday and will come again on Monday to change the bandages. He states that the right leg is almost completely clear and the left leg is healing up as well.). The patient has spots, moles and lesions to be evaluated, some may be new or changing and the patient has concerns that these could be cancer.  The following portions of the chart were reviewed this encounter and updated as appropriate:   Tobacco  Allergies  Meds  Problems  Med Hx  Surg Hx  Fam Hx     Review of Systems:  No other skin or systemic complaints except as noted in HPI or Assessment and Plan.  Objective  Well appearing patient in no apparent distress; mood and affect are within normal limits.  A focused examination was performed including scalp, face. Relevant physical exam findings are noted in the Assessment and Plan.  Scalp (4) Erythematous stuck-on, waxy papule or plaque  Face (18) Erythematous thin papules/macules with gritty scale.   Legs Legs wrapped today (by Home Health)   Assessment & Plan  Inflamed seborrheic keratosis (4) Scalp  Destruction of lesion - Scalp Complexity: simple   Destruction method: cryotherapy   Informed consent: discussed and consent obtained   Timeout:  patient name, date of birth, surgical site, and procedure verified Lesion destroyed using liquid nitrogen: Yes   Region frozen until ice ball extended beyond lesion: Yes   Outcome: patient tolerated procedure well with no complications   Post-procedure details: wound care instructions given    AK (actinic keratosis) (18) Face  Destruction of lesion - Face Complexity: simple   Destruction method: cryotherapy   Informed consent: discussed and consent obtained   Timeout:  patient name, date of birth, surgical site, and procedure  verified Lesion destroyed using liquid nitrogen: Yes   Region frozen until ice ball extended beyond lesion: Yes   Outcome: patient tolerated procedure well with no complications   Post-procedure details: wound care instructions given    Venous stasis dermatitis of both lower extremities Legs  Per patient - legs are healing well, right leg is almost completely clear. He is currently being followed by Home Health. Advised him that once he is released from [facility], he should resume wearing compression socks daily to avoid further trouble with legs.  Actinic Damage - chronic, secondary to cumulative UV radiation exposure/sun exposure over time - diffuse scaly erythematous macules with underlying dyspigmentation - Recommend daily broad spectrum sunscreen SPF 30+ to sun-exposed areas, reapply every 2 hours as needed.  - Recommend staying in the shade or wearing long sleeves, sun glasses (UVA+UVB protection) and wide brim hats (4-inch brim around the entire circumference of the hat). - Call for new or changing lesions.  Return in about 6 months (around 05/15/2022) for Follow up.  I, Ashok Cordia, CMA, am acting as scribe for Sarina Ser, MD . Documentation: I have reviewed the above documentation for accuracy and completeness, and I agree with the above.  Sarina Ser, MD

## 2021-11-14 NOTE — Patient Instructions (Signed)
Due to recent changes in healthcare laws, you may see results of your pathology and/or laboratory studies on MyChart before the doctors have had a chance to review them. We understand that in some cases there may be results that are confusing or concerning to you. Please understand that not all results are received at the same time and often the doctors may need to interpret multiple results in order to provide you with the best plan of care or course of treatment. Therefore, we ask that you please give us 2 business days to thoroughly review all your results before contacting the office for clarification. Should we see a critical lab result, you will be contacted sooner.   If You Need Anything After Your Visit  If you have any questions or concerns for your doctor, please call our main line at [phone] and press option 4 to reach your doctor's medical assistant. If no one answers, please leave a voicemail as directed and we will return your call as soon as possible. Messages left after 4 pm will be answered the following business day.   You may also send us a message via MyChart. We typically respond to MyChart messages within 1-2 business days.  For prescription refills, please ask your pharmacy to contact our office. Our fax number is [phone].  If you have an urgent issue when the clinic is closed that cannot wait until the next business day, you can page your doctor at the number below.    Please note that while we do our best to be available for urgent issues outside of office hours, we are not available 24/7.   If you have an urgent issue and are unable to reach us, you may choose to seek medical care at your doctor's office, retail clinic, urgent care center, or emergency room.  If you have a medical emergency, please immediately call 911 or go to the emergency department.  Pager Numbers  - Dr. Kowalski: [phone]  - Dr. Moye: [phone]  - Dr. Stewart:  [phone]  In the event of inclement weather, please call our main line at [phone] for an update on the status of any delays or closures.  Dermatology Medication Tips: Please keep the boxes that topical medications come in in order to help keep track of the instructions about where and how to use these. Pharmacies typically print the medication instructions only on the boxes and not directly on the medication tubes.   If your medication is too expensive, please contact our office at [phone] option 4 or send us a message through MyChart.   We are unable to tell what your co-pay for medications will be in advance as this is different depending on your insurance coverage. However, we may be able to find a substitute medication at lower cost or fill out paperwork to get insurance to cover a needed medication.   If a prior authorization is required to get your medication covered by your insurance company, please allow us 1-2 business days to complete this process.  Drug prices often vary depending on where the prescription is filled and some pharmacies may offer cheaper prices.  The website www.goodrx.com contains coupons for medications through different pharmacies. The prices here do not account for what the cost may be with help from insurance (it may be cheaper with your insurance), but the website can give you the price if you did not use any insurance.  - You can print the associated coupon and take it with   your prescription to the pharmacy.  - You may also stop by our office during regular business hours and pick up a GoodRx coupon card.  - If you need your prescription sent electronically to a different pharmacy, notify our office through [facility] MyChart or by phone at [phone] option 4.     Si Usted Necesita Algo Despus de Su Visita  Tambin puede enviarnos un mensaje a travs de MyChart. Por lo general respondemos a los mensajes de MyChart en el transcurso de 1 a 2  das hbiles.  Para renovar recetas, por favor pida a su farmacia que se ponga en contacto con nuestra oficina. Nuestro nmero de fax es el [phone].  Si tiene un asunto urgente cuando la clnica est cerrada y que no puede esperar hasta el siguiente da hbil, puede llamar/localizar a su doctor(a) al nmero que aparece a continuacin.   Por favor, tenga en cuenta que aunque hacemos todo lo posible para estar disponibles para asuntos urgentes fuera del horario de oficina, no estamos disponibles las 24 horas del da, los 7 das de la semana.   Si tiene un problema urgente y no puede comunicarse con nosotros, puede optar por buscar atencin mdica  en el consultorio de su doctor(a), en una clnica privada, en un centro de atencin urgente o en una sala de emergencias.  Si tiene una emergencia mdica, por favor llame inmediatamente al 911 o vaya a la sala de emergencias.  Nmeros de bper  - Dr. Kowalski: [phone]  - Dra. Moye: [phone]  - Dra. Stewart: [phone]  En caso de inclemencias del tiempo, por favor llame a nuestra lnea principal al [phone] para una actualizacin sobre el estado de cualquier retraso o cierre.  Consejos para la medicacin en dermatologa: Por favor, guarde las cajas en las que vienen los medicamentos de uso tpico para ayudarle a seguir las instrucciones sobre dnde y cmo usarlos. Las farmacias generalmente imprimen las instrucciones del medicamento slo en las cajas y no directamente en los tubos del medicamento.   Si su medicamento es muy caro, por favor, pngase en contacto con nuestra oficina llamando al [phone] y presione la opcin 4 o envenos un mensaje a travs de MyChart.   No podemos decirle cul ser su copago por los medicamentos por adelantado ya que esto es diferente dependiendo de la cobertura de su seguro. Sin embargo, es posible que podamos encontrar un medicamento sustituto a menor costo o llenar un formulario para que el  seguro cubra el medicamento que se considera necesario.   Si se requiere una autorizacin previa para que su compaa de seguros cubra su medicamento, por favor permtanos de 1 a 2 das hbiles para completar este proceso.  Los precios de los medicamentos varan con frecuencia dependiendo del lugar de dnde se surte la receta y alguna farmacias pueden ofrecer precios ms baratos.  El sitio web www.goodrx.com tiene cupones para medicamentos de diferentes farmacias. Los precios aqu no tienen en cuenta lo que podra costar con la ayuda del seguro (puede ser ms barato con su seguro), pero el sitio web puede darle el precio si no utiliz ningn seguro.  - Puede imprimir el cupn correspondiente y llevarlo con su receta a la farmacia.  - Tambin puede pasar por nuestra oficina durante el horario de atencin regular y recoger una tarjeta de cupones de GoodRx.  - Si necesita que su receta se enve electrnicamente a una farmacia diferente, informe a nuestra oficina a travs de MyChart de [place]   o por telfono llamando al [phone] y presione la opcin 4.  

## 2021-11-15 ENCOUNTER — Ambulatory Visit (INDEPENDENT_AMBULATORY_CARE_PROVIDER_SITE_OTHER): Payer: Medicare HMO

## 2021-11-15 ENCOUNTER — Ambulatory Visit: Payer: Medicare HMO | Attending: Cardiology | Admitting: Cardiology

## 2021-11-15 ENCOUNTER — Telehealth: Payer: Self-pay | Admitting: Cardiology

## 2021-11-15 ENCOUNTER — Encounter: Payer: Self-pay | Admitting: Cardiology

## 2021-11-15 VITALS — BP 130/80 | HR 64 | Ht 70.0 in | Wt 285.0 lb

## 2021-11-15 DIAGNOSIS — I10 Essential (primary) hypertension: Secondary | ICD-10-CM | POA: Diagnosis not present

## 2021-11-15 DIAGNOSIS — I2510 Atherosclerotic heart disease of native coronary artery without angina pectoris: Secondary | ICD-10-CM

## 2021-11-15 DIAGNOSIS — E782 Mixed hyperlipidemia: Secondary | ICD-10-CM

## 2021-11-15 DIAGNOSIS — I5032 Chronic diastolic (congestive) heart failure: Secondary | ICD-10-CM | POA: Diagnosis not present

## 2021-11-15 DIAGNOSIS — G4733 Obstructive sleep apnea (adult) (pediatric): Secondary | ICD-10-CM | POA: Diagnosis not present

## 2021-11-15 DIAGNOSIS — R001 Bradycardia, unspecified: Secondary | ICD-10-CM

## 2021-11-15 NOTE — Progress Notes (Unsigned)
Cardiology Office Note:    Date:  11/15/2021   ID:  Anthony Perkins, DOB 04-28-1940, MRN [id number]  PCP:  Bonnita Nasuti, MD  Cardiologist:  Jenne Campus, MD    Referring MD: Bonnita Nasuti, MD   Chief Complaint  Patient presents with   Follow-up    History of Present Illness:    Anthony Perkins is a 81 y.o. male   with past medical history significant for coronary artery disease.  He did have PTCA and stenting with drug-eluting stent of circumflex artery in 2016.  Also have history of essential hypertension, obstructive sleep apnea but he refused CPAP mask, dyslipidemia.  He comes today to my office for follow-up.  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  It looks like it did not happen he denies having any chest pain tightness squeezing pressure burning chest no dizziness no passing out.  He did have some infection of the skin on the leg cellulitis that being treated with antibiotic much better  Past Medical History:  Diagnosis Date   Arthritis KNEES   Coronary artery disease    Coronary artery disease involving native coronary artery of native heart without angina pectoris 08/08/2014   Overview:  PTCA and stenting to circumflex artery at the beginning of 2016   Decreased hearing of right ear 07/14/2018   History of cellulitis of skin with lymphangitis 2004   STATES WEARS TED HOSE DAILY--  NO SWELLING BUT SKIN IS RED  SINCE 2004 EPISODE   History of urinary tract obstruction SECONDARY BPH   Hypercholesterolemia 08/08/2014   Hyperlipidemia    Hypertension 02/15/2016   Melanoma ([place]) 1996   L post ear - lentigo maligna   Nocturia    Obstructive sleep apnea syndrome 08/08/2014   OSA (obstructive sleep apnea)    Prostate cancer ([place]) 12/20/2010   gleason 6, vol 42 cc   PSA elevation    Right chronic serous otitis media 08/02/2018   Vasomotor rhinitis 08/02/2018     Past Surgical History:  Procedure Laterality Date   CARDIAC CATHETERIZATION     CYSTOSCOPY  07/30/2011   Procedure: CYSTOSCOPY FLEXIBLE;  Surgeon: Joie Bimler, MD;  Location: [facility];  Service: Urology;  Laterality: N/A;  no seeds found in bladder   [place] BIOPSY  12/20/10  DR Nila Nephew 'S OFFICE   Gleason 6, vol 42 cc   RADIOACTIVE SEED IMPLANT  07/30/2011   Procedure: RADIOACTIVE SEED IMPLANT;  Surgeon: Joie Bimler, MD;  Location: [facility];  Service: Urology;  Laterality: N/A;  64 seeds implanted   ROTATOR CUFF REPAIR  NOV 2011   RIGHT SHOULDER   TONSILLECTOMY AND ADENOIDECTOMY  1948    Current Medications: Current Meds  Medication Sig   aspirin EC 81 MG tablet Take 81 mg by mouth daily.   atorvastatin (LIPITOR) 80 MG tablet TAKE 1 TABLET BY MOUTH EVERY DAY   clopidogrel (PLAVIX) 75 MG tablet TAKE 1 TABLET BY MOUTH EVERY DAY   clotrimazole-betamethasone (LOTRISONE) cream Apply 1 application topically daily.   desoximetasone (TOPICORT) 0.05 % cream Apply to rash on B/L lower leg BID 5d/wk.   finasteride (PROSCAR) 5 MG tablet Take 5 mg by mouth daily.   fluconazole (DIFLUCAN) 200 MG tablet Take one tab po QW for 3  mths   fluorouracil (EFUDEX) 5 % cream Apply 1 application topically in the morning and at bedtime.   ketoconazole (NIZORAL) 2 % cream Apply to irritation in groin area QHS.   lisinopril (ZESTRIL) 2.5 MG tablet Take 1 tablet (2.5 mg total) by mouth daily.   nitroGLYCERIN (NITROSTAT) 0.4 MG SL tablet Place 1 tablet (0.4 mg total) under the tongue as needed for chest pain.   ranolazine (RANEXA) 500 MG 12 hr tablet Take 1 tablet (500 mg total) by mouth 2 (two) times daily.   tamsulosin (FLOMAX) 0.4 MG CAPS capsule Take 0.4 mg by mouth daily.   torsemide (DEMADEX) 20 MG tablet Take 1.5 tablets (30 mg total) by mouth daily.     Allergies:   Hydrocodone   Social History   Socioeconomic History    Marital status: Married    Spouse name: Not on file   Number of children: Not on file   Years of education: Not on file   Highest education level: Not on file  Occupational History   Not on file  Tobacco Use   Smoking status: Never   Smokeless tobacco: Never  Vaping Use   Vaping Use: Never used  Substance and Sexual Activity   Alcohol use: No   Drug use: No   Sexual activity: Not on file  Other Topics Concern   Not on file  Social History Narrative   Married, 3 children, truck Geophysicist/field seismologist            Social Determinants of Health   Financial Resource Strain: Not on file  Food Insecurity: Not on file  Transportation Needs: Not on file  Physical Activity: Not on file  Stress: Not on file  Social Connections: Not on file     Family History: The patient's family history includes Cancer in his father and paternal grandmother. ROS:   Please see the history of present illness.    All 14 point review of systems negative except as described per history of present illness  EKGs/Labs/Other Studies Reviewed:      Recent Labs: 10/11/2021: BUN 28; Creatinine, Ser 1.07; Potassium 4.8; Sodium 140  Recent Lipid Panel    Component Value Date/Time   CHOL 108 09/11/2020 1440   TRIG 92 09/11/2020 1440   HDL 38 (L) 09/11/2020 1440   CHOLHDL 2.8 09/11/2020 1440   LDLCALC 52 09/11/2020 1440    Physical Exam:    VS:  BP 130/80 (BP Location: Left Arm, Patient Position: Sitting, Cuff Size: Large)   Pulse 64   Ht '5\' 10"'$  (1.778 m)   Wt 285 lb (129.3 kg)   SpO2 95%   BMI 40.89 kg/m     Wt Readings from Last 3 Encounters:  11/15/21 285 lb (129.3 kg)  10/03/21 276 lb 3.2 oz (125.3 kg)  03/18/21 280 lb 9.6 oz (127.3 kg)     GEN:  Well nourished, well developed in no acute distress HEENT: Normal NECK: No JVD; No carotid bruits LYMPHATICS: No lymphadenopathy CARDIAC: RRR, no murmurs, no rubs, no gallops RESPIRATORY:  Clear to auscultation without rales, wheezing or rhonchi   ABDOMEN: Soft, non-tender, non-distended MUSCULOSKELETAL:  No edema; No deformity  SKIN: Warm and dry LOWER EXTREMITIES: no swelling NEUROLOGIC:  Alert and oriented x 3 PSYCHIATRIC:  Normal affect   ASSESSMENT:    1. Coronary artery disease involving native coronary artery of native heart without angina pectoris   2. Primary hypertension   3. Chronic diastolic congestive heart failure ([place])   4.  Obstructive sleep apnea syndrome   5. Mixed hyperlipidemia    PLAN:    In order of problems listed above:  Bradycardia.  I will ask him to wear Zio patch.  He is EKG show me to PVCs which I suspect is responsible for his bradycardia.  But Zio patch is appropriate. Essential hypertension blood pressure well controlled. Dyslipidemia I did review his K PN which show me his LDL 52 HDL 38.  We will continue present management. Congestive heart failure seems to be compensated   Medication Adjustments/Labs and Tests Ordered: Current medicines are reviewed at length with the patient today.  Concerns regarding medicines are outlined above.  No orders of the defined types were placed in this encounter.  Medication changes: No orders of the defined types were placed in this encounter.   Signed, Park Liter, MD, [facility] 11/15/2021 11:32 AM    [place]

## 2021-11-15 NOTE — Telephone Encounter (Signed)
Message from [place]- when called back it was wrong number.

## 2021-11-15 NOTE — Telephone Encounter (Signed)
STAT if HR is under 50 or over 120 (normal HR is 60-100 beats per minute)  What is your heart rate? 44  Do you have a log of your heart rate readings (document readings)? Yes   Do you have any other symptoms? Gained 4 lbs in 2 days, SOB when active

## 2021-11-15 NOTE — Patient Instructions (Signed)
Medication Instructions:  Your physician recommends that you continue on your current medications as directed. Please refer to the Current Medication list given to you today.  *If you need a refill on your cardiac medications before your next appointment, please call your pharmacy*   Lab Work: None Ordered If you have labs (blood work) drawn today and your tests are completely normal, you will receive your results only by: MyChart Message (if you have MyChart) OR A paper copy in the mail If you have any lab test that is abnormal or we need to change your treatment, we will call you to review the results.   Testing/Procedures:  WHY IS MY DOCTOR PRESCRIBING ZIO? The Zio system is proven and trusted by physicians to detect and diagnose irregular heart rhythms -- and has been prescribed to hundreds of thousands of patients.  The FDA has cleared the Zio system to monitor for many different kinds of irregular heart rhythms. In a study, physicians were able to reach a diagnosis 90% of the time with the Zio system1.  You can wear the Zio monitor -- a small, discreet, comfortable patch -- during your normal day-to-day activity, including while you sleep, shower, and exercise, while it records every single heartbeat for analysis.  1Barrett, P., et al. Comparison of 24 Hour Holter Monitoring Versus 14 Day Novel Adhesive Patch Electrocardiographic Monitoring. American Journal of Medicine, 2014.  ZIO VS. HOLTER MONITORING The Zio monitor can be comfortably worn for up to 14 days. Holter monitors can be worn for 24 to 48 hours, limiting the time to record any irregular heart rhythms you may have. Zio is able to capture data for the 51% of patients who have their first symptom-triggered arrhythmia after 48 hours.1  LIVE WITHOUT RESTRICTIONS The Zio ambulatory cardiac monitor is a small, unobtrusive, and water-resistant patch--you might even forget you're wearing it. The Zio monitor records and stores  every beat of your heart, whether you're sleeping, working out, or showering.     Follow-Up: At [facility], you and your health needs are our priority.  As part of our continuing mission to provide you with exceptional heart care, we have created designated Provider Care Teams.  These Care Teams include your primary Cardiologist (physician) and Advanced Practice Providers (APPs -  Physician Assistants and Nurse Practitioners) who all work together to provide you with the care you need, when you need it.  We recommend signing up for the patient portal called "MyChart".  Sign up information is provided on this After Visit Summary.  MyChart is used to connect with patients for Virtual Visits (Telemedicine).  Patients are able to view lab/test results, encounter notes, upcoming appointments, etc.  Non-urgent messages can be sent to your provider as well.   To learn more about what you can do with MyChart, go to [email or web address]://www.mychart.com.    Your next appointment:   6 month(s)  The format for your next appointment:   In Person  Provider:   Robert Krasowski, MD    Other Instructions NA  

## 2021-11-15 NOTE — Telephone Encounter (Signed)
Anthony Perkins called and stated that the nurse came out today and was reporting his HR and weight. Pt was seen today and has on a ZIO. He stated that he was feeling ok and there was no changes.

## 2021-11-23 ENCOUNTER — Encounter: Payer: Self-pay | Admitting: Dermatology

## 2021-11-25 ENCOUNTER — Telehealth: Payer: Self-pay | Admitting: Cardiology

## 2021-11-25 NOTE — Telephone Encounter (Signed)
Tillie Rung, with [facility] called stating patient had a 6lbs weight gain in 4 days. She saw him on Friday, but wasn't able to call us since it was after hours. She states patient told her he wasn't taking his fluid pill because it was making him sick.  So she thinks he didn't take his fluid pill Wednesday, Thursday or Friday.

## 2021-11-26 ENCOUNTER — Telehealth: Payer: Self-pay | Admitting: Cardiology

## 2021-11-26 MED ORDER — FUROSEMIDE 40 MG PO TABS: 40.0000 mg | ORAL_TABLET | Freq: Every day | ORAL | 3 refills | Status: AC

## 2021-11-26 NOTE — Telephone Encounter (Signed)
Tillie Rung from [facility] is returning call from yesterday about patient's weight gain and said that no one reached back out to her about patient

## 2021-11-26 NOTE — Telephone Encounter (Signed)
Spoke with Tillie Rung and Mrs Mula about the change from Torsemide to Lasix. Tillie Rung will send message to nurse. Will send Lasix Rx to pharmacy. Chem 7- 3 to 4 days.

## 2021-12-06 ENCOUNTER — Telehealth: Payer: Self-pay | Admitting: Cardiology

## 2021-12-06 NOTE — Telephone Encounter (Signed)
  Pt c/o swelling: STAT is pt has developed SOB within 24 hours  If swelling, where is the swelling located? Right is swollen   How much weight have you gained and in what time span? No   Have you gained 3 pounds in a day or 5 pounds in a week? No   Do you have a log of your daily weights (if so, list)? No   Are you currently taking a fluid pill? Yes   Are you currently SOB? No   Have you traveled recently? No    Pt's wife calling she said, pt is having swelling right leg is more swollen than left leg

## 2021-12-06 NOTE — Telephone Encounter (Signed)
Spoke with Sherrine Maples who was calling for results of Zio results. Advised that results are not completed and we will call once Dr. Agustin Cree has reviewed.   Gold states that pt has cellulitis in his lower leg again. Advised to call PCP or go to Urgent Care.  Sherrine Maples verbalized understanding and had no additional questions.

## 2021-12-20 MED ORDER — METOPROLOL SUCCINATE ER 50 MG PO TB24: 50.0000 mg | ORAL_TABLET | Freq: Every day | ORAL | 3 refills | Status: AC

## 2021-12-20 NOTE — Telephone Encounter (Signed)
Results reviewed with pt as per Dr. Wendy Poet note. Metoprolol '50mg'$  q d sent to [facility] per Dr. Wendy Poet note.   Pt verbalized understanding and had no additional questions. Routed to PCP

## 2022-02-06 DEATH — deceased

## 2022-02-11 ENCOUNTER — Other Ambulatory Visit: Payer: Self-pay | Admitting: Cardiology

## 2022-02-21 ENCOUNTER — Other Ambulatory Visit: Payer: Self-pay | Admitting: Cardiology

## 2022-05-22 ENCOUNTER — Ambulatory Visit: Payer: Medicare HMO | Admitting: Dermatology
